# Patient Record
Sex: Male | Born: 1945
Health system: Southern US, Community
[De-identification: ages and names within clinical notes are randomized; demographics above are authoritative.]

## PROBLEM LIST (undated history)

## (undated) DIAGNOSIS — E669 Obesity, unspecified: Secondary | ICD-10-CM

## (undated) DIAGNOSIS — G4733 Obstructive sleep apnea (adult) (pediatric): Secondary | ICD-10-CM

## (undated) DIAGNOSIS — I350 Nonrheumatic aortic (valve) stenosis: Secondary | ICD-10-CM

## (undated) DIAGNOSIS — Z8719 Personal history of other diseases of the digestive system: Secondary | ICD-10-CM

## (undated) DIAGNOSIS — Z953 Presence of xenogenic heart valve: Secondary | ICD-10-CM

## (undated) DIAGNOSIS — A6920 Lyme disease, unspecified: Secondary | ICD-10-CM

## (undated) DIAGNOSIS — K759 Inflammatory liver disease, unspecified: Secondary | ICD-10-CM

## (undated) DIAGNOSIS — E785 Hyperlipidemia, unspecified: Secondary | ICD-10-CM

## (undated) DIAGNOSIS — K219 Gastro-esophageal reflux disease without esophagitis: Secondary | ICD-10-CM

## (undated) DIAGNOSIS — J45909 Unspecified asthma, uncomplicated: Secondary | ICD-10-CM

## (undated) DIAGNOSIS — R0600 Dyspnea, unspecified: Secondary | ICD-10-CM

## (undated) DIAGNOSIS — G4730 Sleep apnea, unspecified: Secondary | ICD-10-CM

## (undated) HISTORY — DX: Nonrheumatic aortic (valve) stenosis: I35.0

## (undated) HISTORY — DX: Dyspnea, unspecified: R06.00

## (undated) HISTORY — DX: Obstructive sleep apnea (adult) (pediatric): G47.33

## (undated) HISTORY — DX: Hyperlipidemia, unspecified: E78.5

## (undated) HISTORY — PX: HERNIA REPAIR: SHX51

## (undated) HISTORY — PX: FRACTURE SURGERY: SHX138

## (undated) HISTORY — PX: EYE SURGERY: SHX253

## (undated) HISTORY — DX: Obesity, unspecified: E66.9

## (undated) HISTORY — DX: Sleep apnea, unspecified: G47.30

---

## 2003-04-19 ENCOUNTER — Encounter (INDEPENDENT_AMBULATORY_CARE_PROVIDER_SITE_OTHER): Payer: Self-pay | Admitting: *Deleted

## 2003-04-19 ENCOUNTER — Ambulatory Visit (HOSPITAL_COMMUNITY): Admission: RE | Admit: 2003-04-19 | Discharge: 2003-04-19 | Payer: Self-pay | Admitting: Gastroenterology

## 2008-05-23 ENCOUNTER — Encounter: Admission: RE | Admit: 2008-05-23 | Discharge: 2008-05-23 | Payer: Self-pay | Admitting: General Surgery

## 2011-04-07 ENCOUNTER — Other Ambulatory Visit: Payer: Self-pay | Admitting: Gastroenterology

## 2011-04-11 NOTE — Op Note (Signed)
NAME:  Devon Novak, Devon Novak                        ACCOUNT NO.:  [id number]   MEDICAL RECORD NO.:  [id number]                   PATIENT TYPE:  AMB   LOCATION:  ENDO                                 FACILITY:  [facility]   PHYSICIAN:  Petra Kuba, M.D.                 DATE OF BIRTH:  20-Aug-1946   DATE OF PROCEDURE:  04/19/2003  DATE OF DISCHARGE:                                 OPERATIVE REPORT   PROCEDURE:  Colonoscopy with polypectomy.   INDICATIONS FOR PROCEDURE:  Family history of colon cancer.  Consent was  signed after risks, benefits, methods, and options were thoroughly discussed  in the office.   MEDICATIONS GIVEN:  Demerol 75, Versed 10.   DESCRIPTION OF PROCEDURE:  Rectal inspection was pertinent for external  hemorrhoids.  Small digital examination was negative.  Video colonoscope was  inserted and easily advanced around the colon to the cecum. This did require  some abdominal pressure, but no position changes. No obvious abnormality was  seen on insertion.  The cecum was identified by the appendiceal orifice and  the ileocecal valve. The scope was slowly withdrawn.  Prep was adequate.  There was some liquid stool that required washing and suctioning.  On slow  withdrawal through the colon, the cecum and the ascending were normal.  In  the more proximal transverse, a small polyp was seen and snared,  electrocautery applied and the polyp was suctioned through the scope and  collected in the trap. The scope was slowly withdrawn.  Back in the distal  sigmoid, a tiny hyperplastic-appearing polyp was seen and a few cold  biopsies were done.  No other abnormalities were seen as the scope was  slowly withdrawn back in the rectum.  Once back in the rectum, the scope was  retroflexed pertinent for some internal hemorrhoids.  The scope was  straightened and readvanced a short ways up the left side of the colon.  Air  was suctioned and the scope removed.  The patient tolerated the  procedure  well.  There was no obvious immediate complications.   ENDOSCOPIC ASSESSMENT:  1. Internal and external hemorrhoids.  2. Proximal transverse small polyp snared.  3. Distal sigmoid hyperplastic-appearing polyp cold biopsied.  4. Otherwise within normal limits to the cecum.   PLAN:  Await pathology to determine future colonic screening.  Happy to see  back p.r.n.  Otherwise return care to Dr. Abigail Miyamoto for the customary health  care maintenance to include yearly rectals and guaiacs.                                               Petra Kuba, M.D.   MEM/MEDQ  D:  04/19/2003  T:  04/19/2003  Job:  [id number]   cc:   Molly Maduro  K. Abigail Miyamoto, M.D.  [address]  [place]  [place] [address]  Fax: [phone]

## 2014-01-31 DIAGNOSIS — N529 Male erectile dysfunction, unspecified: Secondary | ICD-10-CM | POA: Diagnosis not present

## 2014-01-31 DIAGNOSIS — R972 Elevated prostate specific antigen [PSA]: Secondary | ICD-10-CM | POA: Diagnosis not present

## 2014-02-07 DIAGNOSIS — J3489 Other specified disorders of nose and nasal sinuses: Secondary | ICD-10-CM | POA: Diagnosis not present

## 2014-02-07 DIAGNOSIS — H109 Unspecified conjunctivitis: Secondary | ICD-10-CM | POA: Diagnosis not present

## 2014-02-20 DIAGNOSIS — M25569 Pain in unspecified knee: Secondary | ICD-10-CM | POA: Diagnosis not present

## 2014-05-18 DIAGNOSIS — R439 Unspecified disturbances of smell and taste: Secondary | ICD-10-CM | POA: Diagnosis not present

## 2014-06-28 DIAGNOSIS — IMO0001 Reserved for inherently not codable concepts without codable children: Secondary | ICD-10-CM | POA: Diagnosis not present

## 2014-08-01 DIAGNOSIS — R972 Elevated prostate specific antigen [PSA]: Secondary | ICD-10-CM | POA: Diagnosis not present

## 2014-08-09 DIAGNOSIS — Z23 Encounter for immunization: Secondary | ICD-10-CM | POA: Diagnosis not present

## 2014-08-09 DIAGNOSIS — M1990 Unspecified osteoarthritis, unspecified site: Secondary | ICD-10-CM | POA: Diagnosis not present

## 2014-08-09 DIAGNOSIS — E782 Mixed hyperlipidemia: Secondary | ICD-10-CM | POA: Diagnosis not present

## 2014-08-09 DIAGNOSIS — R7309 Other abnormal glucose: Secondary | ICD-10-CM | POA: Diagnosis not present

## 2014-08-09 DIAGNOSIS — Z0000 Encounter for general adult medical examination without abnormal findings: Secondary | ICD-10-CM | POA: Diagnosis not present

## 2014-08-10 DIAGNOSIS — N529 Male erectile dysfunction, unspecified: Secondary | ICD-10-CM | POA: Diagnosis not present

## 2014-08-10 DIAGNOSIS — N401 Enlarged prostate with lower urinary tract symptoms: Secondary | ICD-10-CM | POA: Diagnosis not present

## 2014-08-10 DIAGNOSIS — R972 Elevated prostate specific antigen [PSA]: Secondary | ICD-10-CM | POA: Diagnosis not present

## 2014-08-10 DIAGNOSIS — N138 Other obstructive and reflux uropathy: Secondary | ICD-10-CM | POA: Diagnosis not present

## 2014-09-15 DIAGNOSIS — R3912 Poor urinary stream: Secondary | ICD-10-CM | POA: Diagnosis not present

## 2014-09-15 DIAGNOSIS — R339 Retention of urine, unspecified: Secondary | ICD-10-CM | POA: Diagnosis not present

## 2014-09-15 DIAGNOSIS — N401 Enlarged prostate with lower urinary tract symptoms: Secondary | ICD-10-CM | POA: Diagnosis not present

## 2014-10-03 DIAGNOSIS — R339 Retention of urine, unspecified: Secondary | ICD-10-CM | POA: Diagnosis not present

## 2014-10-03 DIAGNOSIS — R3912 Poor urinary stream: Secondary | ICD-10-CM | POA: Diagnosis not present

## 2014-10-03 DIAGNOSIS — R972 Elevated prostate specific antigen [PSA]: Secondary | ICD-10-CM | POA: Diagnosis not present

## 2014-10-03 DIAGNOSIS — N401 Enlarged prostate with lower urinary tract symptoms: Secondary | ICD-10-CM | POA: Diagnosis not present

## 2014-10-03 DIAGNOSIS — N5201 Erectile dysfunction due to arterial insufficiency: Secondary | ICD-10-CM | POA: Diagnosis not present

## 2014-11-20 DIAGNOSIS — H2513 Age-related nuclear cataract, bilateral: Secondary | ICD-10-CM | POA: Diagnosis not present

## 2015-01-12 DIAGNOSIS — R011 Cardiac murmur, unspecified: Secondary | ICD-10-CM | POA: Diagnosis not present

## 2015-02-26 DIAGNOSIS — N401 Enlarged prostate with lower urinary tract symptoms: Secondary | ICD-10-CM | POA: Diagnosis not present

## 2015-02-26 DIAGNOSIS — N5201 Erectile dysfunction due to arterial insufficiency: Secondary | ICD-10-CM | POA: Diagnosis not present

## 2015-02-26 DIAGNOSIS — R339 Retention of urine, unspecified: Secondary | ICD-10-CM | POA: Diagnosis not present

## 2015-02-26 DIAGNOSIS — R972 Elevated prostate specific antigen [PSA]: Secondary | ICD-10-CM | POA: Diagnosis not present

## 2015-08-24 DIAGNOSIS — Z0000 Encounter for general adult medical examination without abnormal findings: Secondary | ICD-10-CM | POA: Diagnosis not present

## 2015-08-24 DIAGNOSIS — Q253 Supravalvular aortic stenosis: Secondary | ICD-10-CM | POA: Diagnosis not present

## 2015-08-24 DIAGNOSIS — N400 Enlarged prostate without lower urinary tract symptoms: Secondary | ICD-10-CM | POA: Diagnosis not present

## 2015-08-24 DIAGNOSIS — G4730 Sleep apnea, unspecified: Secondary | ICD-10-CM | POA: Diagnosis not present

## 2015-08-24 DIAGNOSIS — E785 Hyperlipidemia, unspecified: Secondary | ICD-10-CM | POA: Diagnosis not present

## 2015-08-24 DIAGNOSIS — Z79899 Other long term (current) drug therapy: Secondary | ICD-10-CM | POA: Diagnosis not present

## 2015-08-24 DIAGNOSIS — Z23 Encounter for immunization: Secondary | ICD-10-CM | POA: Diagnosis not present

## 2015-08-24 DIAGNOSIS — Z86010 Personal history of colonic polyps: Secondary | ICD-10-CM | POA: Diagnosis not present

## 2015-12-27 DIAGNOSIS — M542 Cervicalgia: Secondary | ICD-10-CM | POA: Diagnosis not present

## 2016-01-31 DIAGNOSIS — F4320 Adjustment disorder, unspecified: Secondary | ICD-10-CM | POA: Diagnosis not present

## 2016-02-07 DIAGNOSIS — F4320 Adjustment disorder, unspecified: Secondary | ICD-10-CM | POA: Diagnosis not present

## 2016-02-14 DIAGNOSIS — F4320 Adjustment disorder, unspecified: Secondary | ICD-10-CM | POA: Diagnosis not present

## 2016-02-26 DIAGNOSIS — E785 Hyperlipidemia, unspecified: Secondary | ICD-10-CM | POA: Diagnosis not present

## 2016-02-26 DIAGNOSIS — R509 Fever, unspecified: Secondary | ICD-10-CM | POA: Diagnosis not present

## 2016-02-26 DIAGNOSIS — M791 Myalgia: Secondary | ICD-10-CM | POA: Diagnosis not present

## 2016-02-27 DIAGNOSIS — M791 Myalgia: Secondary | ICD-10-CM | POA: Diagnosis not present

## 2016-02-27 DIAGNOSIS — E8809 Other disorders of plasma-protein metabolism, not elsewhere classified: Secondary | ICD-10-CM | POA: Diagnosis not present

## 2016-02-28 DIAGNOSIS — F4320 Adjustment disorder, unspecified: Secondary | ICD-10-CM | POA: Diagnosis not present

## 2016-03-13 DIAGNOSIS — F4320 Adjustment disorder, unspecified: Secondary | ICD-10-CM | POA: Diagnosis not present

## 2016-03-19 DIAGNOSIS — S61012A Laceration without foreign body of left thumb without damage to nail, initial encounter: Secondary | ICD-10-CM | POA: Diagnosis not present

## 2016-03-27 DIAGNOSIS — F4320 Adjustment disorder, unspecified: Secondary | ICD-10-CM | POA: Diagnosis not present

## 2016-04-07 DIAGNOSIS — E668 Other obesity: Secondary | ICD-10-CM | POA: Diagnosis not present

## 2016-04-07 DIAGNOSIS — R002 Palpitations: Secondary | ICD-10-CM | POA: Diagnosis not present

## 2016-04-07 DIAGNOSIS — G4733 Obstructive sleep apnea (adult) (pediatric): Secondary | ICD-10-CM | POA: Diagnosis not present

## 2016-04-07 DIAGNOSIS — I359 Nonrheumatic aortic valve disorder, unspecified: Secondary | ICD-10-CM | POA: Diagnosis not present

## 2016-04-07 DIAGNOSIS — R011 Cardiac murmur, unspecified: Secondary | ICD-10-CM | POA: Diagnosis not present

## 2016-04-07 DIAGNOSIS — E784 Other hyperlipidemia: Secondary | ICD-10-CM | POA: Diagnosis not present

## 2016-04-07 DIAGNOSIS — I350 Nonrheumatic aortic (valve) stenosis: Secondary | ICD-10-CM | POA: Diagnosis not present

## 2016-04-10 DIAGNOSIS — F4320 Adjustment disorder, unspecified: Secondary | ICD-10-CM | POA: Diagnosis not present

## 2016-07-09 DIAGNOSIS — R972 Elevated prostate specific antigen [PSA]: Secondary | ICD-10-CM | POA: Diagnosis not present

## 2016-08-14 DIAGNOSIS — N401 Enlarged prostate with lower urinary tract symptoms: Secondary | ICD-10-CM | POA: Diagnosis not present

## 2016-08-14 DIAGNOSIS — R339 Retention of urine, unspecified: Secondary | ICD-10-CM | POA: Diagnosis not present

## 2016-08-14 DIAGNOSIS — N5201 Erectile dysfunction due to arterial insufficiency: Secondary | ICD-10-CM | POA: Diagnosis not present

## 2016-08-14 DIAGNOSIS — R972 Elevated prostate specific antigen [PSA]: Secondary | ICD-10-CM | POA: Diagnosis not present

## 2016-09-05 DIAGNOSIS — G4730 Sleep apnea, unspecified: Secondary | ICD-10-CM | POA: Diagnosis not present

## 2016-09-05 DIAGNOSIS — Z0000 Encounter for general adult medical examination without abnormal findings: Secondary | ICD-10-CM | POA: Diagnosis not present

## 2016-09-05 DIAGNOSIS — Z23 Encounter for immunization: Secondary | ICD-10-CM | POA: Diagnosis not present

## 2016-09-05 DIAGNOSIS — Z86010 Personal history of colonic polyps: Secondary | ICD-10-CM | POA: Diagnosis not present

## 2016-09-05 DIAGNOSIS — Q253 Supravalvular aortic stenosis: Secondary | ICD-10-CM | POA: Diagnosis not present

## 2016-09-05 DIAGNOSIS — E8809 Other disorders of plasma-protein metabolism, not elsewhere classified: Secondary | ICD-10-CM | POA: Diagnosis not present

## 2016-09-05 DIAGNOSIS — R972 Elevated prostate specific antigen [PSA]: Secondary | ICD-10-CM | POA: Diagnosis not present

## 2016-09-05 DIAGNOSIS — N400 Enlarged prostate without lower urinary tract symptoms: Secondary | ICD-10-CM | POA: Diagnosis not present

## 2016-09-05 DIAGNOSIS — E782 Mixed hyperlipidemia: Secondary | ICD-10-CM | POA: Diagnosis not present

## 2016-09-05 DIAGNOSIS — Z79899 Other long term (current) drug therapy: Secondary | ICD-10-CM | POA: Diagnosis not present

## 2016-10-21 DIAGNOSIS — Z86010 Personal history of colonic polyps: Secondary | ICD-10-CM | POA: Diagnosis not present

## 2016-10-21 DIAGNOSIS — K635 Polyp of colon: Secondary | ICD-10-CM | POA: Diagnosis not present

## 2016-10-21 DIAGNOSIS — Z800 Family history of malignant neoplasm of digestive organs: Secondary | ICD-10-CM | POA: Diagnosis not present

## 2016-10-21 DIAGNOSIS — D123 Benign neoplasm of transverse colon: Secondary | ICD-10-CM | POA: Diagnosis not present

## 2016-10-23 DIAGNOSIS — K635 Polyp of colon: Secondary | ICD-10-CM | POA: Diagnosis not present

## 2016-11-24 DIAGNOSIS — A6920 Lyme disease, unspecified: Secondary | ICD-10-CM

## 2016-11-24 HISTORY — DX: Lyme disease, unspecified: A69.20

## 2017-01-19 DIAGNOSIS — R339 Retention of urine, unspecified: Secondary | ICD-10-CM | POA: Diagnosis not present

## 2017-01-19 DIAGNOSIS — N401 Enlarged prostate with lower urinary tract symptoms: Secondary | ICD-10-CM | POA: Diagnosis not present

## 2017-01-19 DIAGNOSIS — N5201 Erectile dysfunction due to arterial insufficiency: Secondary | ICD-10-CM | POA: Diagnosis not present

## 2017-01-19 DIAGNOSIS — R972 Elevated prostate specific antigen [PSA]: Secondary | ICD-10-CM | POA: Diagnosis not present

## 2017-04-06 DIAGNOSIS — I350 Nonrheumatic aortic (valve) stenosis: Secondary | ICD-10-CM | POA: Diagnosis not present

## 2017-04-06 DIAGNOSIS — R002 Palpitations: Secondary | ICD-10-CM | POA: Diagnosis not present

## 2017-06-24 DIAGNOSIS — R509 Fever, unspecified: Secondary | ICD-10-CM | POA: Diagnosis not present

## 2017-06-24 DIAGNOSIS — R21 Rash and other nonspecific skin eruption: Secondary | ICD-10-CM | POA: Diagnosis not present

## 2017-06-26 DIAGNOSIS — Z6832 Body mass index (BMI) 32.0-32.9, adult: Secondary | ICD-10-CM | POA: Diagnosis not present

## 2017-06-26 DIAGNOSIS — R509 Fever, unspecified: Secondary | ICD-10-CM | POA: Diagnosis not present

## 2017-06-26 DIAGNOSIS — R899 Unspecified abnormal finding in specimens from other organs, systems and tissues: Secondary | ICD-10-CM | POA: Diagnosis not present

## 2017-06-26 DIAGNOSIS — E669 Obesity, unspecified: Secondary | ICD-10-CM | POA: Diagnosis not present

## 2017-06-30 DIAGNOSIS — R509 Fever, unspecified: Secondary | ICD-10-CM | POA: Diagnosis not present

## 2017-06-30 DIAGNOSIS — R21 Rash and other nonspecific skin eruption: Secondary | ICD-10-CM | POA: Diagnosis not present

## 2017-06-30 DIAGNOSIS — R5081 Fever presenting with conditions classified elsewhere: Secondary | ICD-10-CM | POA: Diagnosis not present

## 2017-07-16 DIAGNOSIS — E668 Other obesity: Secondary | ICD-10-CM | POA: Diagnosis not present

## 2017-07-16 DIAGNOSIS — G4733 Obstructive sleep apnea (adult) (pediatric): Secondary | ICD-10-CM | POA: Diagnosis not present

## 2017-07-16 DIAGNOSIS — E784 Other hyperlipidemia: Secondary | ICD-10-CM | POA: Diagnosis not present

## 2017-07-16 DIAGNOSIS — I350 Nonrheumatic aortic (valve) stenosis: Secondary | ICD-10-CM | POA: Diagnosis not present

## 2017-08-24 DIAGNOSIS — I10 Essential (primary) hypertension: Secondary | ICD-10-CM | POA: Diagnosis not present

## 2017-08-24 DIAGNOSIS — H2512 Age-related nuclear cataract, left eye: Secondary | ICD-10-CM | POA: Diagnosis not present

## 2017-08-24 DIAGNOSIS — H2513 Age-related nuclear cataract, bilateral: Secondary | ICD-10-CM | POA: Diagnosis not present

## 2017-10-05 DIAGNOSIS — E668 Other obesity: Secondary | ICD-10-CM | POA: Diagnosis not present

## 2017-10-05 DIAGNOSIS — G4733 Obstructive sleep apnea (adult) (pediatric): Secondary | ICD-10-CM | POA: Diagnosis not present

## 2017-10-05 DIAGNOSIS — I350 Nonrheumatic aortic (valve) stenosis: Secondary | ICD-10-CM | POA: Diagnosis not present

## 2017-10-08 DIAGNOSIS — Z0000 Encounter for general adult medical examination without abnormal findings: Secondary | ICD-10-CM | POA: Diagnosis not present

## 2017-10-08 DIAGNOSIS — N400 Enlarged prostate without lower urinary tract symptoms: Secondary | ICD-10-CM | POA: Diagnosis not present

## 2017-10-08 DIAGNOSIS — M1990 Unspecified osteoarthritis, unspecified site: Secondary | ICD-10-CM | POA: Diagnosis not present

## 2017-10-08 DIAGNOSIS — Z79899 Other long term (current) drug therapy: Secondary | ICD-10-CM | POA: Diagnosis not present

## 2017-10-08 DIAGNOSIS — G4730 Sleep apnea, unspecified: Secondary | ICD-10-CM | POA: Diagnosis not present

## 2017-10-08 DIAGNOSIS — Z23 Encounter for immunization: Secondary | ICD-10-CM | POA: Diagnosis not present

## 2017-10-08 DIAGNOSIS — R899 Unspecified abnormal finding in specimens from other organs, systems and tissues: Secondary | ICD-10-CM | POA: Diagnosis not present

## 2017-10-08 DIAGNOSIS — E782 Mixed hyperlipidemia: Secondary | ICD-10-CM | POA: Diagnosis not present

## 2017-10-08 DIAGNOSIS — E669 Obesity, unspecified: Secondary | ICD-10-CM | POA: Diagnosis not present

## 2017-10-08 DIAGNOSIS — S61214A Laceration without foreign body of right ring finger without damage to nail, initial encounter: Secondary | ICD-10-CM | POA: Diagnosis not present

## 2017-10-08 DIAGNOSIS — Z6833 Body mass index (BMI) 33.0-33.9, adult: Secondary | ICD-10-CM | POA: Diagnosis not present

## 2017-10-08 DIAGNOSIS — Z86010 Personal history of colonic polyps: Secondary | ICD-10-CM | POA: Diagnosis not present

## 2017-10-08 DIAGNOSIS — Q253 Supravalvular aortic stenosis: Secondary | ICD-10-CM | POA: Diagnosis not present

## 2017-10-22 DIAGNOSIS — H2512 Age-related nuclear cataract, left eye: Secondary | ICD-10-CM | POA: Diagnosis not present

## 2017-10-22 DIAGNOSIS — H52202 Unspecified astigmatism, left eye: Secondary | ICD-10-CM | POA: Diagnosis not present

## 2017-10-22 DIAGNOSIS — H2513 Age-related nuclear cataract, bilateral: Secondary | ICD-10-CM | POA: Diagnosis not present

## 2017-10-23 DIAGNOSIS — H2511 Age-related nuclear cataract, right eye: Secondary | ICD-10-CM | POA: Diagnosis not present

## 2017-10-24 HISTORY — PX: CATARACT EXTRACTION, BILATERAL: SHX1313

## 2017-10-26 DIAGNOSIS — R768 Other specified abnormal immunological findings in serum: Secondary | ICD-10-CM | POA: Diagnosis not present

## 2017-10-26 DIAGNOSIS — M791 Myalgia, unspecified site: Secondary | ICD-10-CM | POA: Diagnosis not present

## 2017-10-26 DIAGNOSIS — Z8619 Personal history of other infectious and parasitic diseases: Secondary | ICD-10-CM | POA: Diagnosis not present

## 2017-10-26 DIAGNOSIS — M1990 Unspecified osteoarthritis, unspecified site: Secondary | ICD-10-CM | POA: Diagnosis not present

## 2017-10-26 DIAGNOSIS — Z85038 Personal history of other malignant neoplasm of large intestine: Secondary | ICD-10-CM | POA: Diagnosis not present

## 2017-10-26 DIAGNOSIS — M2550 Pain in unspecified joint: Secondary | ICD-10-CM | POA: Diagnosis not present

## 2017-10-28 DIAGNOSIS — H2513 Age-related nuclear cataract, bilateral: Secondary | ICD-10-CM | POA: Diagnosis not present

## 2017-11-05 DIAGNOSIS — H2511 Age-related nuclear cataract, right eye: Secondary | ICD-10-CM | POA: Diagnosis not present

## 2017-11-23 DIAGNOSIS — J069 Acute upper respiratory infection, unspecified: Secondary | ICD-10-CM | POA: Diagnosis not present

## 2018-01-07 DIAGNOSIS — M791 Myalgia, unspecified site: Secondary | ICD-10-CM | POA: Diagnosis not present

## 2018-01-07 DIAGNOSIS — M2550 Pain in unspecified joint: Secondary | ICD-10-CM | POA: Diagnosis not present

## 2018-01-07 DIAGNOSIS — M1990 Unspecified osteoarthritis, unspecified site: Secondary | ICD-10-CM | POA: Diagnosis not present

## 2018-01-07 DIAGNOSIS — R768 Other specified abnormal immunological findings in serum: Secondary | ICD-10-CM | POA: Diagnosis not present

## 2018-01-07 DIAGNOSIS — Z8619 Personal history of other infectious and parasitic diseases: Secondary | ICD-10-CM | POA: Diagnosis not present

## 2018-01-07 DIAGNOSIS — Z85038 Personal history of other malignant neoplasm of large intestine: Secondary | ICD-10-CM | POA: Diagnosis not present

## 2018-01-08 DIAGNOSIS — R972 Elevated prostate specific antigen [PSA]: Secondary | ICD-10-CM | POA: Diagnosis not present

## 2018-01-14 DIAGNOSIS — N401 Enlarged prostate with lower urinary tract symptoms: Secondary | ICD-10-CM | POA: Diagnosis not present

## 2018-01-14 DIAGNOSIS — N5201 Erectile dysfunction due to arterial insufficiency: Secondary | ICD-10-CM | POA: Diagnosis not present

## 2018-01-14 DIAGNOSIS — R3912 Poor urinary stream: Secondary | ICD-10-CM | POA: Diagnosis not present

## 2018-01-14 DIAGNOSIS — R972 Elevated prostate specific antigen [PSA]: Secondary | ICD-10-CM | POA: Diagnosis not present

## 2018-04-05 ENCOUNTER — Encounter: Payer: Self-pay | Admitting: Cardiology

## 2018-04-05 DIAGNOSIS — I359 Nonrheumatic aortic valve disorder, unspecified: Secondary | ICD-10-CM | POA: Diagnosis not present

## 2018-04-05 DIAGNOSIS — E668 Other obesity: Secondary | ICD-10-CM | POA: Diagnosis not present

## 2018-04-05 DIAGNOSIS — I350 Nonrheumatic aortic (valve) stenosis: Secondary | ICD-10-CM | POA: Diagnosis not present

## 2018-04-05 DIAGNOSIS — E7849 Other hyperlipidemia: Secondary | ICD-10-CM | POA: Diagnosis not present

## 2018-04-05 DIAGNOSIS — G4733 Obstructive sleep apnea (adult) (pediatric): Secondary | ICD-10-CM | POA: Diagnosis not present

## 2018-04-05 NOTE — Consult Note (Signed)
[facility]  Date of visit:  04/05/2018 DOB:  07/15/1946    Age:  72 yrs. Medical record number:  [id number]     Account number:  [id number] Primary Care Provider: [name] ____________________________ CURRENT DIAGNOSES  1. Aortic valve stenosis  2. Sleep apnea  3. Obesity  4. Hyperlipidemia  5. Dyspnea ____________________________ ALLERGIES  No Known Allergies ____________________________ MEDICATIONS  1. aspirin 81 mg chewable tablet, 1 p.o. daily  2. atorvastatin 40 mg tablet, 1 p.o. daily  3. CoQ-10 30 mg capsule, 1 p.o. daily  4. finasteride 5 mg tablet, 1 p.o. daily  5. Glucosamine-Chondroitin-MSM 750 mg-625 mg-30 mg-1 mg tablet, 1 p.o. daily  6. melatonin 5 mg tablet, QHS  7. multivitamin tablet, 1 p.o. daily  8. saw palmetto fruit 450 mg capsule, 1 p.o. daily  9. sildenafil 20 mg tablet, PRN  10. turmeric root extract 500 mg capsule, 1 p.o. daily  11. zolpidem 10 mg tablet, PRN ____________________________ CHIEF COMPLAINTS  dyspnea  Followup of Aortic valve stenosis ____________________________ HISTORY OF PRESENT ILLNESS This 72 year old male was seen by me 3 years ago murmur was noted on a preemployment physical. He was found to have moderate aortic stenosis with a mean gradient of 32 mm and has been followed since then with serial ECHOs. He is physically active and previously asymptomatic previously and has been able to hike the [place] trail. He had been asymptomatic until a few weeks ago when he noticed significant dyspnea on beginning to walk up hills. He has not had any chest pain suggestive of angina or syncope. He returned today for followup and had a repeat echo today showing a mean gradient now of 51 mm and mild to moderate aortic regurgitation. He denies PND orthopnea or significant edema. He is moderately overweight. ____________________________ PAST HISTORY  Past Medical Illnesses:  hyperlipidemia, obesity, sleep apnea, BPH;  Cardiovascular  Illnesses:  aortic stenosis;  Surgical Procedures:  inguinal herniorrhaphy-rt, inguinal herniorrhaphy-left, umbilical hernia;  NYHA Classification:  I;  Canadian Angina Classification:  Class 0: Asymptomatic;  Cardiology Procedures-Invasive:  no history of prior cardiac procedures;  Cardiology Procedures-Noninvasive:  treadmill, echocardiogram May 2019;  LVEF of 60% documented via echocardiogram on 04/05/2018,   ____________________________ CARDIO-PULMONARY TEST DATES EKG Date:  07/16/2017;  Echocardiography Date: 04/05/2018;   ____________________________ FAMILY HISTORY Brother -- Brother alive and well Brother -- Brother alive and well Father -- Renal failure syndrome, Father dead Mother -- Bowel cancer, Mother dead Sister -- Sister alive and well ____________________________ SOCIAL HISTORY Alcohol Use:  2 q week;  Smoking:  cigars occasionally;  Lifestyle:  divorced and remarried;  Exercise:  yoga and dance;  Occupation:  retired, Surveyor, [employer];  Residence:  lives with wife;   ____________________________ REVIEW OF SYSTEMS General:  denies recent weight change, fatique or change in exercise tolerance. Eyes: cataracts Respiratory: dyspnea with exertion Cardiovascular:  please review HPI  Genitourinary-Male: hesitancy, erectile dysfunction  Musculoskeletal:  arthritis of the wrists Neurological:  denies headaches, stroke, or TIA Psychiatric:  denies depression or anxiety  ____________________________ PHYSICAL EXAMINATION VITAL SIGNS  Blood Pressure:  130/80 Sitting, Left arm, large cuff  , 140/78 Standing, Left arm and large cuff   Pulse:  68/min. Weight:  223.00 lbs. Height:  69.00"BMI: 33  Constitutional:  pleasant white male in no acute distress, mildly obese Skin:  warm and dry to touch, no apparent skin lesions, or masses noted. Head:  normocephalic, balding male hair pattern Neck:  supple, without massess. No JVD, thyromegaly or  carotid bruits. Carotid upstroke normal. Chest:  normal  symmetry, clear to auscultation. Cardiac:  regular rhythm, normal S1, S2 diminished, no S3 or S4, grade 2-3 holosytolic mumur loudest at aortic area and along LSB but heard also at apex somehwat softer, mild transmission to the neck Peripheral Pulses:  the femoral,dorsalis pedis, and posterior tibial pulses are full and equal bilaterally with no bruits auscultated. Extremities & Back:  no deformities, clubbing, cyanosis, erythema or edema observed. Normal muscle strength and tone. Neurological:  no gross motor or sensory deficits noted, affect appropriate, oriented x3. ____________________________ MOST RECENT LIPID PANEL 10/08/17  CHOL TOTL 120 mg/dl, LDL 54 NM, HDL 55 mg/dl, TRIGLYCER 53 mg/dl, ALT 17 u/l and AST 26 u/l ____________________________ IMPRESSIONS/PLAN  1. Severe symptomatic aortic stenosis 2. Sleep apnea 3. Obesity with weight gain 4. Hyperlipidemia under tratment  Recommendations:  Results of echocardiogram reviewed with patient. He now has symptomatic aortic stenosis and although he was severe at his last visit  now symptoms have recently appeared so we'll need to consider valve replacement. Plan to refer to the structural heart disease team for cardiac catheterization and further assessment. Discussed cardiac catheterization with the patient today. ____________________________ Cardiology Physician:  Kerry Hough MD [facility]

## 2018-04-08 ENCOUNTER — Ambulatory Visit (INDEPENDENT_AMBULATORY_CARE_PROVIDER_SITE_OTHER): Payer: Medicare Other | Admitting: Cardiovascular Disease

## 2018-04-08 VITALS — BP 150/90 | HR 61 | Ht 69.0 in | Wt 224.4 lb

## 2018-04-08 DIAGNOSIS — I350 Nonrheumatic aortic (valve) stenosis: Secondary | ICD-10-CM

## 2018-04-08 LAB — BASIC METABOLIC PANEL
BUN / CREAT RATIO: 12 (ref 10–24)
BUN: 12 mg/dL (ref 8–27)
CALCIUM: 9 mg/dL (ref 8.6–10.2)
CO2: 24 mmol/L (ref 20–29)
CREATININE: 0.97 mg/dL (ref 0.76–1.27)
Chloride: 103 mmol/L (ref 96–106)
GFR calc non Af Amer: 78 mL/min/{1.73_m2} (ref >59)
GFR, EST AFRICAN AMERICAN: 90 mL/min/{1.73_m2} (ref >59)
GLUCOSE: 98 mg/dL (ref 65–99)
Potassium: 4.6 mmol/L (ref 3.5–5.2)
Sodium: 140 mmol/L (ref 134–144)

## 2018-04-08 LAB — CBC WITH DIFFERENTIAL/PLATELET
BASOS ABS: 0 10*3/uL (ref 0.0–0.2)
Basos: 1 %
EOS (ABSOLUTE): 0.1 10*3/uL (ref 0.0–0.4)
EOS: 2 %
HEMOGLOBIN: 13.9 g/dL (ref 13.0–17.7)
Hematocrit: 39.3 % (ref 37.5–51.0)
IMMATURE GRANS (ABS): 0 10*3/uL (ref 0.0–0.1)
Immature Granulocytes: 0 %
LYMPHS ABS: 1.5 10*3/uL (ref 0.7–3.1)
LYMPHS: 22 %
MCH: 30.6 pg (ref 26.6–33.0)
MCHC: 35.4 g/dL (ref 31.5–35.7)
MCV: 87 fL (ref 79–97)
MONOCYTES: 11 %
Monocytes Absolute: 0.8 10*3/uL (ref 0.1–0.9)
Neutrophils Absolute: 4.4 10*3/uL (ref 1.4–7.0)
Neutrophils: 64 %
Platelets: 249 10*3/uL (ref 150–379)
RBC: 4.54 x10E6/uL (ref 4.14–5.80)
RDW: 13.5 % (ref 12.3–15.4)
WBC: 6.8 10*3/uL (ref 3.4–10.8)

## 2018-04-08 NOTE — H&P (View-Only) (Signed)
Cardiology Office Note Date:  04/08/2018   ID:  Devon Novak, DOB [birth year], MRN [id number]  PCP:  [name], MD  Cardiologist:  Dr Wynonia Lawman   Chief Complaint  Patient presents with  . Aortic Stenosis     History of Present Illness: Devon Novak is a 72 y.o. male who presents for evaluation of symptomatic aortic stenosis, referred by Dr Wynonia Lawman.  He is here alone today. The patient was initially referred to Dr Wynonia Lawman approximately 3 years ago when he was diagnosed with a heart murmur. He was found to have moderate aortic stenosis and has been followed with annual exams and echo studies since that time. He has been asymptomatic until 6-8 weeks ago. Last year he was able to hike the [place] without significant symptoms. However, when he tried to resume hiking this year, he has become limited by shortness of breath. He now admits to progressive dyspnea and chest tightness with physical activity. He has symptoms associated with climbing stairs.  The patient has had no chest pain or shortness of breath at rest.  He denies edema, orthopnea, or PND.  He has had no lightheadedness or syncope.   Past Medical History:  Diagnosis Date  . Aortic valve stenosis   . Dyspnea   . Hyperlipidemia   . Obesity   . Sleep apnea     History reviewed. No pertinent surgical history.  Current Outpatient Medications  Medication Sig Dispense Refill  . aspirin EC 81 MG tablet Take 81 mg by mouth daily.    [name] atorvastatin (LIPITOR) 40 MG tablet Take 40 mg by mouth daily.    [name] co-enzyme Q-10 30 MG capsule Take 30 mg by mouth 3 (three) times daily.    . finasteride (PROSCAR) 5 MG tablet Take 5 mg by mouth daily.    . Melatonin 5 MG TABS Take by mouth.    . Misc Natural Products (GLUCOSAMINE CHOND MSM FORMULA PO) Take by mouth. 750 MG/625MG /30 MG/1 MG TABLET, TAKE 1 TABLET BY MOUTH DAILY    . Multiple Vitamin (MULTIVITAMIN) tablet Take 1 tablet by mouth daily.    . Saw Palmetto 450 MG CAPS  Take by mouth.    . sildenafil (REVATIO) 20 MG tablet Take 20 mg by mouth as needed.    . Turmeric 500 MG CAPS Take by mouth. TAKE ONE TABLET DAILY    . zolpidem (AMBIEN) 10 MG tablet Take 10 mg by mouth at bedtime as needed for sleep.     No current facility-administered medications for this visit.     Allergies:   Patient has no known allergies.   Social History:  The patient  reports that he has quit smoking. His smoking use included cigarettes. He quit smokeless tobacco use about 44 years ago. His smokeless tobacco use included chew. He reports that he drinks alcohol. He reports that he does not use drugs.   Family History:  The patient's  family history includes Arrhythmia in his sister.  Heart murmur in his sister. No CAD in the family.   ROS:  Please see the history of present illness.  Otherwise, review of systems is positive for cough.  All other systems are reviewed and negative.    PHYSICAL EXAM: VS:  BP (!) 150/90   Pulse 61   Ht 5\' 9"  (1.753 m)   Wt 224 lb 6.4 oz (101.8 kg)   SpO2 97%   BMI 33.14 kg/m  , BMI Body mass index is 33.14 kg/m.  GEN: Well nourished, well developed, in no acute distress  HEENT: normal  Neck: no JVD, no masses. Bilateral carotid bruits Cardiac: RRR with 3/6 harsh late peaking systolic murmur, diminished A2, and no appreciable diastolic murmur     Respiratory:  clear to auscultation bilaterally, normal work of breathing GI: soft, nontender, nondistended, + BS MS: no deformity or atrophy  Ext: no pretibial edema, pedal pulses 2+= bilaterally Skin: warm and dry, no rash Neuro:  Strength and sensation are intact Psych: euthymic mood, full affect  EKG:  EKG is ordered today. The ekg ordered today shows normal sinus rhythm 61 bpm, T wave abnormality consider lateral ischemia.  Recent Labs: No results found for requested labs within last 8760 hours.   Lipid Panel  No results found for: CHOL, TRIG, HDL, CHOLHDL, VLDL, LDLCALC, LDLDIRECT     Wt Readings from Last 3 Encounters:  04/08/18 224 lb 6.4 oz (101.8 kg)    STS Risk Calculator: Risk of Mortality: 0.561% Renal Failure: 0.640% Permanent Stroke: 0.815% Prolonged Ventilation: 2.837% DSW Infection: 0.099% Reoperation: 2.780% Morbidity or Mortality: 6.047% Short Length of Stay: 61.369% Long Length of Stay: 1.583%  ASSESSMENT AND PLAN: This is a 72 year old gentleman with severe, stage D1, symptomatic aortic stenosis.  He now presents with progressive dyspnea and chest discomfort, currently with functional class III limitation.  I have personally reviewed his echo images which demonstrate normal LV systolic function.  The aortic valve is calcified and restricted.  Parasternal acoustic windows are poor, but there does appear to be systolic doming of the aortic valve and the patient may have a bicuspid valve.  There is very severe aortic stenosis with a peak systolic velocity of 5.6 m/s.  There appears to be mild aortic insufficiency.  The peak trans-aortic gradient is greater than 100 mmHg.  I have reviewed the natural history of aortic stenosis with the patient today. We have discussed the limitations of medical therapy and the poor prognosis associated with symptomatic aortic stenosis. We have reviewed potential treatment options, including palliative medical therapy, conventional surgical aortic valve replacement, and transcatheter aortic valve replacement. We discussed treatment options in the context of the patient's specific comorbid medical conditions. Considering his low STS-risk and lack of comorbid medical problems, he understands that he likely will be treated with conventional heart surgery. Our multidisciplinary heart valve team will review his case after cath and CT studies are completed. He is particularly interesting in a mini-thoracotomy approach if this is technically feasible.   We reviewed further evaluation to prepare for aortic valve replacement.  The patient will  undergo right and left heart catheterization and CTA studies of the heart as well as the chest/abdomen/pelvis. I have reviewed the risks, indications, and alternatives to cardiac catheterization, possible angioplasty, and stenting with the patient. Risks include but are not limited to bleeding, infection, vascular injury, stroke, myocardial infection, arrhythmia, kidney injury, radiation-related injury in the case of prolonged fluoroscopy use, emergency cardiac surgery, and death. The patient understands the risks of serious complication is 1-2 in 7829 with diagnostic cardiac cath.  Current medicines are reviewed with the patient today.  The patient does not have concerns regarding medicines.  Labs/ tests ordered today include:   Orders Placed This Encounter  Procedures  . CT CORONARY MORPH W/CTA COR W/SCORE W/CA W/CM &/OR WO/CM  . CT ANGIO ABDOMEN PELVIS  W &/OR WO CONTRAST  . CT ANGIO CHEST AORTA W &/OR WO CONTRAST  . Basic metabolic panel  . CBC with Differential/Platelet  .  Ambulatory referral to Cardiothoracic Surgery  . EKG 12-Lead    Disposition:   FU pending test results  Signed, Sherren Mocha, MD  04/08/2018 12:55 PM    [facility] [place], [place], [place]  [address] Phone: [phone]; Fax: [phone]

## 2018-04-08 NOTE — Progress Notes (Signed)
Cardiology Office Note Date:  04/08/2018   ID:  Devon Novak, DOB 01-22-1946, MRN [id number]  PCP:  [name], MD  Cardiologist:  Dr Wynonia Lawman   Chief Complaint  Patient presents with  . Aortic Stenosis     History of Present Illness: Devon Novak is a 72 y.o. male who presents for evaluation of symptomatic aortic stenosis, referred by Dr Wynonia Lawman.  He is here alone today. The patient was initially referred to Dr Wynonia Lawman approximately 3 years ago when he was diagnosed with a heart murmur. He was found to have moderate aortic stenosis and has been followed with annual exams and echo studies since that time. He has been asymptomatic until 6-8 weeks ago. Last year he was able to hike the [place] without significant symptoms. However, when he tried to resume hiking this year, he has become limited by shortness of breath. He now admits to progressive dyspnea and chest tightness with physical activity. He has symptoms associated with climbing stairs.  The patient has had no chest pain or shortness of breath at rest.  He denies edema, orthopnea, or PND.  He has had no lightheadedness or syncope.   Past Medical History:  Diagnosis Date  . Aortic valve stenosis   . Dyspnea   . Hyperlipidemia   . Obesity   . Sleep apnea     History reviewed. No pertinent surgical history.  Current Outpatient Medications  Medication Sig Dispense Refill  . aspirin EC 81 MG tablet Take 81 mg by mouth daily.    [name] atorvastatin (LIPITOR) 40 MG tablet Take 40 mg by mouth daily.    [name] co-enzyme Q-10 30 MG capsule Take 30 mg by mouth 3 (three) times daily.    . finasteride (PROSCAR) 5 MG tablet Take 5 mg by mouth daily.    . Melatonin 5 MG TABS Take by mouth.    . Misc Natural Products (GLUCOSAMINE CHOND MSM FORMULA PO) Take by mouth. 750 MG/625MG /30 MG/1 MG TABLET, TAKE 1 TABLET BY MOUTH DAILY    . Multiple Vitamin (MULTIVITAMIN) tablet Take 1 tablet by mouth daily.    . Saw Palmetto 450 MG CAPS  Take by mouth.    . sildenafil (REVATIO) 20 MG tablet Take 20 mg by mouth as needed.    . Turmeric 500 MG CAPS Take by mouth. TAKE ONE TABLET DAILY    . zolpidem (AMBIEN) 10 MG tablet Take 10 mg by mouth at bedtime as needed for sleep.     No current facility-administered medications for this visit.     Allergies:   Patient has no known allergies.   Social History:  The patient  reports that he has quit smoking. His smoking use included cigarettes. He quit smokeless tobacco use about 44 years ago. His smokeless tobacco use included chew. He reports that he drinks alcohol. He reports that he does not use drugs.   Family History:  The patient's  family history includes Arrhythmia in his sister.  Heart murmur in his sister. No CAD in the family.   ROS:  Please see the history of present illness.  Otherwise, review of systems is positive for cough.  All other systems are reviewed and negative.    PHYSICAL EXAM: VS:  BP (!) 150/90   Pulse 61   Ht 5\' 9"  (1.753 m)   Wt 224 lb 6.4 oz (101.8 kg)   SpO2 97%   BMI 33.14 kg/m  , BMI Body mass index is 33.14 kg/m.  GEN: Well nourished, well developed, in no acute distress  HEENT: normal  Neck: no JVD, no masses. Bilateral carotid bruits Cardiac: RRR with 3/6 harsh late peaking systolic murmur, diminished A2, and no appreciable diastolic murmur     Respiratory:  clear to auscultation bilaterally, normal work of breathing GI: soft, nontender, nondistended, + BS MS: no deformity or atrophy  Ext: no pretibial edema, pedal pulses 2+= bilaterally Skin: warm and dry, no rash Neuro:  Strength and sensation are intact Psych: euthymic mood, full affect  EKG:  EKG is ordered today. The ekg ordered today shows normal sinus rhythm 61 bpm, T wave abnormality consider lateral ischemia.  Recent Labs: No results found for requested labs within last 8760 hours.   Lipid Panel  No results found for: CHOL, TRIG, HDL, CHOLHDL, VLDL, LDLCALC, LDLDIRECT     Wt Readings from Last 3 Encounters:  04/08/18 224 lb 6.4 oz (101.8 kg)    STS Risk Calculator: Risk of Mortality: 0.561% Renal Failure: 0.640% Permanent Stroke: 0.815% Prolonged Ventilation: 2.837% DSW Infection: 0.099% Reoperation: 2.780% Morbidity or Mortality: 6.047% Short Length of Stay: 61.369% Long Length of Stay: 1.583%  ASSESSMENT AND PLAN: This is a 72 year old gentleman with severe, stage D1, symptomatic aortic stenosis.  He now presents with progressive dyspnea and chest discomfort, currently with functional class III limitation.  I have personally reviewed his echo images which demonstrate normal LV systolic function.  The aortic valve is calcified and restricted.  Parasternal acoustic windows are poor, but there does appear to be systolic doming of the aortic valve and the patient may have a bicuspid valve.  There is very severe aortic stenosis with a peak systolic velocity of 5.6 m/s.  There appears to be mild aortic insufficiency.  The peak trans-aortic gradient is greater than 100 mmHg.  I have reviewed the natural history of aortic stenosis with the patient today. We have discussed the limitations of medical therapy and the poor prognosis associated with symptomatic aortic stenosis. We have reviewed potential treatment options, including palliative medical therapy, conventional surgical aortic valve replacement, and transcatheter aortic valve replacement. We discussed treatment options in the context of the patient's specific comorbid medical conditions. Considering his low STS-risk and lack of comorbid medical problems, he understands that he likely will be treated with conventional heart surgery. Our multidisciplinary heart valve team will review his case after cath and CT studies are completed. He is particularly interesting in a mini-thoracotomy approach if this is technically feasible.   We reviewed further evaluation to prepare for aortic valve replacement.  The patient will  undergo right and left heart catheterization and CTA studies of the heart as well as the chest/abdomen/pelvis. I have reviewed the risks, indications, and alternatives to cardiac catheterization, possible angioplasty, and stenting with the patient. Risks include but are not limited to bleeding, infection, vascular injury, stroke, myocardial infection, arrhythmia, kidney injury, radiation-related injury in the case of prolonged fluoroscopy use, emergency cardiac surgery, and death. The patient understands the risks of serious complication is 1-2 in 9518 with diagnostic cardiac cath.  Current medicines are reviewed with the patient today.  The patient does not have concerns regarding medicines.  Labs/ tests ordered today include:   Orders Placed This Encounter  Procedures  . CT CORONARY MORPH W/CTA COR W/SCORE W/CA W/CM &/OR WO/CM  . CT ANGIO ABDOMEN PELVIS  W &/OR WO CONTRAST  . CT ANGIO CHEST AORTA W &/OR WO CONTRAST  . Basic metabolic panel  . CBC with Differential/Platelet  .  Ambulatory referral to Cardiothoracic Surgery  . EKG 12-Lead    Disposition:   FU pending test results  Signed, Sherren Mocha, MD  04/08/2018 12:55 PM    [facility] [place], [place], [place]  [address] Phone: [phone]; Fax: [phone]

## 2018-04-08 NOTE — Patient Instructions (Addendum)
Medication Instructions:  Your provider recommends that you continue on your current medications as directed. Please refer to the Current Medication list given to you today.    Labwork: TODAY! BMET, CBC  Testing/Procedures: Your physician has requested that you have a cardiac catheterization. Cardiac catheterization is used to diagnose and/or treat various heart conditions. Doctors may recommend this procedure for a number of different reasons. The most common reason is to evaluate chest pain. Chest pain can be a symptom of coronary artery disease (CAD), and cardiac catheterization can show whether plaque is narrowing or blocking your heart's arteries. This procedure is also used to evaluate the valves, as well as measure the blood flow and oxygen levels in different parts of your heart. For further information please visit [email or web address]. Please follow instruction sheet, as given.  Follow-Up: You will be called to arrange follow-up appointments.   Any Other Special Instructions Will Be Listed Below (If Applicable).   [facility] OFF[facility] [address], [place] [address] [place] [address] Dept: [phone] Loc: [phone]  KEVONTAE BURGOON  04/08/2018  You are scheduled for a Cardiac Catheterization on Tuesday, May 28 with Dr. Sherren Mocha.  1. Please arrive at the [facility] (Main Entrance A) at [facility]: [place], [place] [address] at 10:00 AM (two hours before your procedure to ensure your preparation). Free valet parking service is available.   Special note: Every effort is made to have your procedure done on time. Please understand that emergencies sometimes delay scheduled procedures.  2. Diet: Do not eat solid foods past midnight the night before your cath. You may have clear liquids until 5:00AM the morning of your procedure. Stay well hydrated the day before your  cath!  3. Labs: TODAY!  4. Medication instructions in preparation for your procedure:  1) You may take all your medications as directed with sips of water  2) Make sure to take ASPIRIN 81 mg the morning of your procedure  5. Plan for one night stay--bring personal belongings. 6. Bring a current list of your medications and current insurance cards. 7. You MUST have a responsible person to drive you home. 8. Someone MUST be with you the first 24 hours after you arrive home or your discharge will be delayed. 9. Please wear clothes that are easy to get on and off and wear slip-on shoes.  Thank you for allowing [place] to care for you!   -- [facility]     If you need a refill on your cardiac medications before your next appointment, please call your pharmacy.

## 2018-04-12 ENCOUNTER — Encounter: Payer: Self-pay | Admitting: Cardiovascular Disease

## 2018-04-12 NOTE — Telephone Encounter (Signed)
Confirmed all upcoming appointments with patient. Informed him he may drive himself to [place] as long as he does not take any sedatives. He was grateful for clarification.

## 2018-04-13 ENCOUNTER — Other Ambulatory Visit: Payer: Self-pay

## 2018-04-13 ENCOUNTER — Telehealth: Payer: Self-pay | Admitting: *Deleted

## 2018-04-13 MED ORDER — METOPROLOL TARTRATE 50 MG PO TABS: ORAL_TABLET | ORAL | 0 refills | Status: DC

## 2018-04-13 NOTE — Telephone Encounter (Signed)
Pt contacted pre-catheterization scheduled at [facility] for: Tuesday Apr 20, 2018 12 noon Verified arrival time and place: [place] Entrance A at: 10 AM  No solid food after midnight prior to cath, clear liquids until 5 AM day of procedure. Verified allergies in Epic Verified no diabetes medications.  Hold: Sildenafil 24 hours pre procedure  AM meds can be  taken pre-cath with sip of water including: ASA 81 mg  Confirmed patient has responsible person to drive home post procedure and observe patient for 24 hours: yes

## 2018-04-15 ENCOUNTER — Telehealth: Payer: Self-pay | Admitting: Cardiovascular Disease

## 2018-04-15 NOTE — Telephone Encounter (Signed)
Confirmed with Kristopher Oppenheim that Rx was received. Informed patient's wife medication was called in and should be ready for pickup. She was grateful for call.

## 2018-04-15 NOTE — Telephone Encounter (Signed)
New message:       Pt's wife is calling that a prescription was supposed to be called in for the pt for one pill for him to take before his CT on yesterday. Pt needs this pill sent to  [place], [place]

## 2018-04-16 ENCOUNTER — Ambulatory Visit (HOSPITAL_COMMUNITY)
Admission: RE | Admit: 2018-04-16 | Discharge: 2018-04-16 | Disposition: A | Discharge status: Home / Self Care | Payer: Medicare Other | Source: Ambulatory Visit | Attending: Cardiovascular Disease | Admitting: Cardiovascular Disease

## 2018-04-16 ENCOUNTER — Encounter (HOSPITAL_COMMUNITY): Payer: Self-pay

## 2018-04-16 DIAGNOSIS — I77810 Thoracic aortic ectasia: Secondary | ICD-10-CM | POA: Diagnosis not present

## 2018-04-16 DIAGNOSIS — I350 Nonrheumatic aortic (valve) stenosis: Secondary | ICD-10-CM

## 2018-04-16 DIAGNOSIS — I700 Atherosclerosis of aorta: Secondary | ICD-10-CM | POA: Diagnosis not present

## 2018-04-16 MED ORDER — IOPAMIDOL (ISOVUE-370) INJECTION 76%
100.0000 mL | Freq: Once | INTRAVENOUS | Status: AC | PRN
  Administered 2018-04-16: 100 mL via INTRAVENOUS

## 2018-04-16 MED ORDER — IOPAMIDOL (ISOVUE-370) INJECTION 76%
INTRAVENOUS | Status: AC
  Filled 2018-04-16: qty 100

## 2018-04-17 ENCOUNTER — Encounter: Payer: Self-pay | Admitting: Thoracic Surgery (Cardiothoracic Vascular Surgery)

## 2018-04-20 ENCOUNTER — Encounter (HOSPITAL_COMMUNITY)
Admission: RE | Disposition: A | Discharge status: Home / Self Care | Payer: Self-pay | Source: Ambulatory Visit | Attending: Cardiovascular Disease

## 2018-04-20 ENCOUNTER — Ambulatory Visit (HOSPITAL_COMMUNITY)
Admission: RE | Admit: 2018-04-20 | Discharge: 2018-04-20 | Disposition: A | Discharge status: Home / Self Care | Payer: Medicare Other | Source: Ambulatory Visit | Attending: Cardiovascular Disease | Admitting: Cardiovascular Disease

## 2018-04-20 DIAGNOSIS — E785 Hyperlipidemia, unspecified: Secondary | ICD-10-CM | POA: Insufficient documentation

## 2018-04-20 DIAGNOSIS — Z7982 Long term (current) use of aspirin: Secondary | ICD-10-CM | POA: Diagnosis not present

## 2018-04-20 DIAGNOSIS — Z6833 Body mass index (BMI) 33.0-33.9, adult: Secondary | ICD-10-CM | POA: Insufficient documentation

## 2018-04-20 DIAGNOSIS — E669 Obesity, unspecified: Secondary | ICD-10-CM | POA: Insufficient documentation

## 2018-04-20 DIAGNOSIS — Z720 Tobacco use: Secondary | ICD-10-CM | POA: Diagnosis not present

## 2018-04-20 DIAGNOSIS — R0789 Other chest pain: Secondary | ICD-10-CM | POA: Diagnosis not present

## 2018-04-20 DIAGNOSIS — G4730 Sleep apnea, unspecified: Secondary | ICD-10-CM | POA: Insufficient documentation

## 2018-04-20 DIAGNOSIS — I350 Nonrheumatic aortic (valve) stenosis: Secondary | ICD-10-CM

## 2018-04-20 HISTORY — PX: RIGHT/LEFT HEART CATH AND CORONARY ANGIOGRAPHY: CATH118266

## 2018-04-20 HISTORY — DX: Nonrheumatic aortic (valve) stenosis: I35.0

## 2018-04-20 LAB — POCT I-STAT 3, ART BLOOD GAS (G3+)
Acid-base deficit: 2 mmol/L (ref 0.0–2.0)
Bicarbonate: 23.4 mmol/L (ref 20.0–28.0)
O2 Saturation: 98 %
PCO2 ART: 40.6 mmHg (ref 32.0–48.0)
TCO2: 25 mmol/L (ref 22–32)
pH, Arterial: 7.368 (ref 7.350–7.450)
pO2, Arterial: 103 mmHg (ref 83.0–108.0)

## 2018-04-20 LAB — POCT I-STAT 3, VENOUS BLOOD GAS (G3P V)
ACID-BASE DEFICIT: 1 mmol/L (ref 0.0–2.0)
Bicarbonate: 24.3 mmol/L (ref 20.0–28.0)
O2 Saturation: 72 %
TCO2: 26 mmol/L (ref 22–32)
pCO2, Ven: 43.5 mmHg — ABNORMAL LOW (ref 44.0–60.0)
pH, Ven: 7.355 (ref 7.250–7.430)
pO2, Ven: 40 mmHg (ref 32.0–45.0)

## 2018-04-20 SURGERY — RIGHT/LEFT HEART CATH AND CORONARY ANGIOGRAPHY
Anesthesia: LOCAL

## 2018-04-20 MED ORDER — HEPARIN (PORCINE) IN NACL 2-0.9 UNITS/ML
INTRAMUSCULAR | Status: AC | PRN
  Administered 2018-04-20 (×3): 500 mL

## 2018-04-20 MED ORDER — MIDAZOLAM HCL 2 MG/2ML IJ SOLN
INTRAMUSCULAR | Status: AC
  Filled 2018-04-20: qty 2

## 2018-04-20 MED ORDER — HEPARIN SODIUM (PORCINE) 1000 UNIT/ML IJ SOLN
INTRAMUSCULAR | Status: DC | PRN
  Administered 2018-04-20: 4000 [IU] via INTRAVENOUS

## 2018-04-20 MED ORDER — VERAPAMIL HCL 2.5 MG/ML IV SOLN
INTRAVENOUS | Status: DC | PRN
  Administered 2018-04-20: 10 mL via INTRA_ARTERIAL

## 2018-04-20 MED ORDER — FENTANYL CITRATE (PF) 100 MCG/2ML IJ SOLN
INTRAMUSCULAR | Status: AC
  Filled 2018-04-20: qty 2

## 2018-04-20 MED ORDER — HEPARIN SODIUM (PORCINE) 1000 UNIT/ML IJ SOLN
INTRAMUSCULAR | Status: AC
  Filled 2018-04-20: qty 1

## 2018-04-20 MED ORDER — SODIUM CHLORIDE 0.9 % WEIGHT BASED INFUSION: 1.0000 mL/kg/h | INTRAVENOUS | Status: DC

## 2018-04-20 MED ORDER — VERAPAMIL HCL 2.5 MG/ML IV SOLN
INTRAVENOUS | Status: AC
  Filled 2018-04-20: qty 2

## 2018-04-20 MED ORDER — IOHEXOL 350 MG/ML SOLN
INTRAVENOUS | Status: DC | PRN
  Administered 2018-04-20: 75 mL via INTRA_ARTERIAL

## 2018-04-20 MED ORDER — LIDOCAINE HCL (PF) 1 % IJ SOLN
INTRAMUSCULAR | Status: DC | PRN
  Administered 2018-04-20 (×2): 2 mL

## 2018-04-20 MED ORDER — MIDAZOLAM HCL 2 MG/2ML IJ SOLN
INTRAMUSCULAR | Status: DC | PRN
  Administered 2018-04-20: 2 mg via INTRAVENOUS
  Administered 2018-04-20: 1 mg via INTRAVENOUS

## 2018-04-20 MED ORDER — ACETAMINOPHEN 325 MG PO TABS: 650.0000 mg | ORAL_TABLET | ORAL | Status: DC | PRN

## 2018-04-20 MED ORDER — SODIUM CHLORIDE 0.9% FLUSH: 3.0000 mL | Freq: Two times a day (BID) | INTRAVENOUS | Status: DC

## 2018-04-20 MED ORDER — SODIUM CHLORIDE 0.9 % WEIGHT BASED INFUSION
3.0000 mL/kg/h | INTRAVENOUS | Status: AC
  Administered 2018-04-20: 3 mL/kg/h via INTRAVENOUS

## 2018-04-20 MED ORDER — LIDOCAINE HCL (PF) 1 % IJ SOLN
INTRAMUSCULAR | Status: AC
  Filled 2018-04-20: qty 30

## 2018-04-20 MED ORDER — SODIUM CHLORIDE 0.9 % IV SOLN: 250.0000 mL | INTRAVENOUS | Status: DC | PRN

## 2018-04-20 MED ORDER — ASPIRIN 81 MG PO CHEW
CHEWABLE_TABLET | ORAL | Status: AC
  Filled 2018-04-20: qty 1

## 2018-04-20 MED ORDER — HEPARIN (PORCINE) IN NACL 1000-0.9 UT/500ML-% IV SOLN
INTRAVENOUS | Status: AC
  Filled 2018-04-20: qty 500

## 2018-04-20 MED ORDER — ONDANSETRON HCL 4 MG/2ML IJ SOLN: 4.0000 mg | Freq: Four times a day (QID) | INTRAMUSCULAR | Status: DC | PRN

## 2018-04-20 MED ORDER — FENTANYL CITRATE (PF) 100 MCG/2ML IJ SOLN
INTRAMUSCULAR | Status: DC | PRN
  Administered 2018-04-20: 25 ug via INTRAVENOUS

## 2018-04-20 MED ORDER — ASPIRIN 81 MG PO CHEW: 81.0000 mg | CHEWABLE_TABLET | ORAL | Status: DC

## 2018-04-20 MED ORDER — SODIUM CHLORIDE 0.9% FLUSH: 3.0000 mL | INTRAVENOUS | Status: DC | PRN

## 2018-04-20 SURGICAL SUPPLY — 15 items
CATH BALLN WEDGE 5F 110CM (CATHETERS) ×2 IMPLANT
CATH INFINITI 5 FR JL3.5 (CATHETERS) ×2 IMPLANT
CATH INFINITI JR4 5F (CATHETERS) ×2 IMPLANT
DEVICE RAD COMP TR BAND LRG (VASCULAR PRODUCTS) ×2 IMPLANT
GUIDEWIRE .025 260CM (WIRE) ×2 IMPLANT
GUIDEWIRE INQWIRE 1.5J.035X260 (WIRE) ×1 IMPLANT
INQWIRE 1.5J .035X260CM (WIRE) ×2
KIT HEART LEFT (KITS) ×2 IMPLANT
NEEDLE PERC 21GX4CM (NEEDLE) ×2 IMPLANT
PACK CARDIAC CATHETERIZATION (CUSTOM PROCEDURE TRAY) ×2 IMPLANT
SHEATH RAIN 4/5FR (SHEATH) ×2 IMPLANT
SHEATH RAIN RADIAL 21G 6FR (SHEATH) ×2 IMPLANT
TRANSDUCER W/STOPCOCK (MISCELLANEOUS) ×2 IMPLANT
TUBING CIL FLEX 10 FLL-RA (TUBING) ×2 IMPLANT
VALVE MANIFOLD 3 PORT W/RA/ON (MISCELLANEOUS) ×2 IMPLANT

## 2018-04-20 NOTE — Interval H&P Note (Signed)
History and Physical Interval Note:  04/20/2018 1:02 PM  Devon Novak  has presented today for surgery, with the diagnosis of as  The various methods of treatment have been discussed with the patient and family. After consideration of risks, benefits and other options for treatment, the patient has consented to  Procedure(s): RIGHT/LEFT HEART CATH AND CORONARY ANGIOGRAPHY (N/A) as a surgical intervention .  The patient's history has been reviewed, patient examined, no change in status, stable for surgery.  I have reviewed the patient's chart and labs.  Questions were answered to the patient's satisfaction.     Sherren Mocha

## 2018-04-20 NOTE — Research (Signed)
[place] Informed Consent   Subject Name: Devon Novak  Subject met inclusion and exclusion criteria.  The informed consent form, study requirements and expectations were reviewed with the subject and questions and concerns were addressed prior to the signing of the consent form.  The subject verbalized understanding of the trail requirements.  The subject agreed to participate in the [facility] trial and signed the informed consent.  The informed consent was obtained prior to performance of any protocol-specific procedures for the subject.  A copy of the signed informed consent was given to the subject and a copy was placed in the subject's medical record.  Christena Flake 04/20/2018, 10:50 Jerilynn Mages

## 2018-04-20 NOTE — Discharge Instructions (Signed)

## 2018-04-21 ENCOUNTER — Encounter (HOSPITAL_COMMUNITY): Payer: Self-pay | Admitting: Cardiovascular Disease

## 2018-04-22 ENCOUNTER — Encounter: Payer: Medicare Other | Admitting: Thoracic Surgery (Cardiothoracic Vascular Surgery)

## 2018-04-23 ENCOUNTER — Other Ambulatory Visit: Payer: Self-pay

## 2018-04-23 ENCOUNTER — Encounter: Payer: Self-pay | Admitting: Thoracic Surgery (Cardiothoracic Vascular Surgery)

## 2018-04-23 ENCOUNTER — Institutional Professional Consult (permissible substitution) (INDEPENDENT_AMBULATORY_CARE_PROVIDER_SITE_OTHER): Payer: Medicare Other | Admitting: Thoracic Surgery (Cardiothoracic Vascular Surgery)

## 2018-04-23 VITALS — BP 122/72 | HR 75 | Resp 18 | Ht 69.0 in | Wt 220.0 lb

## 2018-04-23 DIAGNOSIS — I350 Nonrheumatic aortic (valve) stenosis: Secondary | ICD-10-CM

## 2018-04-23 NOTE — Progress Notes (Addendum)
HEART AND [place] SURGERY CONSULTATION REPORT  Referring Provider is Sherren Mocha, MD  Primary Cardiologist is Ezzard Standing, MD PCP is [name], MD  Chief Complaint  Patient presents with  . Aortic Stenosis    new patient, CATH 04/20/2018, CT Chest 04/16/2018    HPI:  Patient has a 72 year old moderately obese male with history of aortic stenosis, hyperlipidemia, and obstructive sleep apnea who has been referred for surgical consultation to discuss treatment options for management of severe symptomatic aortic stenosis.  Patient states that he has known of the presence of a heart murmur for several years.  He has been followed by Dr. Wynonia Lawman and more recently by Dr. Burt Knack for aortic stenosis.  Previous echocardiograms revealed findings suggestive of moderate aortic stenosis and the patient has remained asymptomatic.  Over the past several months patient has developed noticeable progression of symptoms of exertional shortness of breath, chest tightness, and fatigue.  Recent follow-up echocardiogram performed at Dr. Thurman Coyer office on Apr 05, 2018 revealed significant progression of disease with severe aortic stenosis.  Peak velocity across the aortic valve was reported 5.1 m/s corresponding to mean transvalvular gradients estimated 63 mmHg.  Left ventricular systolic function remain normal with ejection fraction calculated 60%.  The patient was seen in follow-up by Dr. Burt Knack and underwent diagnostic cardiac catheterization on Apr 20, 2018.  Catheterization confirmed the presence of severe aortic stenosis with mean transvalvular gradient measured 68 mmHg corresponding to aortic valve area calculated 0.74 cm.  There was widely patent coronary arteries with no significant coronary artery disease.  Right heart pressures were normal.  CT angiography was performed and cardiothoracic surgical consultation was requested.  The  patient is married and lives locally in [place].  He has been retired for several years and also retired from the [employer].  He has remained physically active and healthy all of his adult life.  He enjoys hiking and backpacking, and his recently as last year was hiking on the [place] trail.  He describes a long history of mild symptoms of exertional shortness of breath typically brought on only with more strenuous exertion.  Over the last few months the symptoms have progressed dramatically such that he now gets short of breath and tightness across his chest with moderate level activity.  He has not had any resting shortness of breath, PND, orthopnea, or lower extremity edema.  He has not had palpitations, dizzy spells, nor syncope.  Past Medical History:  Diagnosis Date  . Aortic valve stenosis   . Dyspnea   . Hyperlipidemia   . Obesity   . Sleep apnea     Past Surgical History:  Procedure Laterality Date  . RIGHT/LEFT HEART CATH AND CORONARY ANGIOGRAPHY N/A 04/20/2018   Procedure: RIGHT/LEFT HEART CATH AND CORONARY ANGIOGRAPHY;  Surgeon: Sherren Mocha, MD;  Location: [facility];  Service: Cardiovascular;  Laterality: N/A;    Family History  Problem Relation Age of Onset  . Arrhythmia Sister     Social History   Socioeconomic History  . Marital status: Married    Spouse name: Not on file  . Number of children: Not on file  . Years of education: Not on file  . Highest education level: Not on file  Occupational History  . Occupation: retired  Scientific laboratory technician  . Financial resource strain: Not on file  . Food insecurity:    Worry: Never true    Inability: Never true  .  Transportation needs:    Medical: No    Non-medical: No  Tobacco Use  . Smoking status: Former Smoker    Types: Cigarettes  . Smokeless tobacco: Former Systems developer    Types: Chew    Quit date: 3  . Tobacco comment: quit in 70's  Substance and Sexual Activity  . Alcohol use: Yes    Comment: social    . Drug use: Never  . Sexual activity: Not on file  Lifestyle  . Physical activity:    Days per week: 2 days    Minutes per session: 50 min  . Stress: Only a little  Relationships  . Social connections:    Talks on phone: Not on file    Gets together: Not on file    Attends religious service: Not on file    Active member of club or organization: Not on file    Attends meetings of clubs or organizations: Not on file    Relationship status: Not on file  . Intimate partner violence:    Fear of current or ex partner: No    Emotionally abused: No    Physically abused: No    Forced sexual activity: No  Other Topics Concern  . Not on file  Social History Narrative  . Not on file    Current Outpatient Medications  Medication Sig Dispense Refill  . aspirin EC 81 MG tablet Take 81 mg by mouth daily.    [name] atorvastatin (LIPITOR) 40 MG tablet Take 40 mg by mouth daily.    . Cholecalciferol (VITAMIN D-3) 5000 units TABS Take 5,000 Units by mouth daily.    [name] co-enzyme Q-10 30 MG capsule Take 100 mg by mouth daily.     . Cyanocobalamin (B-12) 2500 MCG TABS Take 2,500 mcg by mouth daily.    . finasteride (PROSCAR) 5 MG tablet Take 5 mg by mouth daily.    . magnesium oxide (MAG-OX) 400 MG tablet Take 400 mg by mouth daily.    . Misc Natural Products (GLUCOSAMINE CHOND MSM FORMULA PO) Take 1 tablet by mouth daily.     . Multiple Vitamins-Minerals (MULTIVITAMIN ADULT PO) Take 1 tablet by mouth daily.    . Omega-3 350 MG CPDR Take 350 mg by mouth daily.    [name] OVER THE COUNTER MEDICATION Take 1 tablet by mouth daily. [facility]    . Pomegranate, Punica granatum, (POMEGRANATE EXTRACT) 250 MG CAPS Take 250 mg by mouth daily.    [name] RANITIDINE HCL PO Take 1 tablet by mouth daily as needed (heartburn).    . Saw Palmetto, Serenoa repens, (SAW PALMETTO PO) Take 1 capsule by mouth daily.     . sildenafil (REVATIO) 20 MG tablet Take 40-60 mg by mouth daily as needed (erectile dysfunction).     .  Tetrahydrozoline HCl (VISINE OP) Apply 1 drop to eye daily as needed (dry eyes).    . TURMERIC PO Take 1 tablet by mouth daily.     [name] zolpidem (AMBIEN) 10 MG tablet Take 10 mg by mouth at bedtime as needed for sleep.     No current facility-administered medications for this visit.     No Known Allergies    Review of Systems:   General:  normal appetite, decreased energy, no weight gain, no weight loss, no fever  Cardiac:  + chest pain with exertion, no chest pain at rest, + SOB with exertion, no resting SOB, no PND, no orthopnea, no palpitations, no arrhythmia, no atrial fibrillation, no  LE edema, no dizzy spells, no syncope  Respiratory:  + exertional shortness of breath, no home oxygen, no productive cough, + dry cough, no bronchitis, no wheezing, no hemoptysis, no asthma, no pain with inspiration or cough, + sleep apnea, "occasional" CPAP at night  GI:   no difficulty swallowing, no reflux, no frequent heartburn, no hiatal hernia, no abdominal pain, no constipation, no diarrhea, no hematochezia, no hematemesis, no melena  GU:   no dysuria,  no frequency, no urinary tract infection, no hematuria, + enlarged prostate, no kidney stones, no kidney disease  Vascular:  no pain suggestive of claudication, no pain in feet, no leg cramps, no varicose veins, no DVT, no non-healing foot ulcer  Neuro:   no stroke, no TIA's, no seizures, no headaches, no temporary blindness one eye,  no slurred speech, no peripheral neuropathy, no chronic pain, no instability of gait, no memory/cognitive dysfunction  Musculoskeletal: no arthritis, no joint swelling, no myalgias, no difficulty walking, normal mobility   Skin:   no rash, no itching, no skin infections, no pressure sores or ulcerations  Psych:   no anxiety, no depression, no nervousness, no unusual recent stress  Eyes:   no blurry vision, no floaters, no recent vision changes, no wears glasses or contacts  ENT:   + hearing loss, no loose or painful teeth,  no dentures, last saw dentist within the past month  Hematologic:  no easy bruising, no abnormal bleeding, no clotting disorder, no frequent epistaxis  Endocrine:  no diabetes, does not check CBG's at home           Physical Exam:   BP 122/72 (BP Location: Right Arm, Patient Position: Sitting, Cuff Size: Normal)   Pulse 75   Resp 18   Ht 5\' 9"  (1.753 m)   Wt 220 lb (99.8 kg)   SpO2 97% Comment: RA  BMI 32.49 kg/m   General:  Mildly obese,  well-appearing  HEENT:  Unremarkable   Neck:   no JVD, no bruits, no adenopathy   Chest:   clear to auscultation, symmetrical breath sounds, no wheezes, no rhonchi   CV:   RRR, grade III/VI crescendo/decrescendo murmur heard best at RSB,  no diastolic murmur  Abdomen:  soft, non-tender, no masses   Extremities:  warm, well-perfused, pulses palpable, no LE edema  Rectal/GU  Deferred  Neuro:   Grossly non-focal and symmetrical throughout  Skin:   Clean and dry, no rashes, no breakdown   Diagnostic Tests:  TRANSTHORACIC ECHOCARDIOGRAM  Both images and report from recent transthoracic echocardiogram performed at Dr. Thurman Coyer office on Apr 05, 2018 have been reviewed.  The patient versus normal left ventricular systolic function.  Aortic valve appears trileaflet with severe thickening, calcification, and restricted leaflet mobility.  Peak velocity across aortic valve measured 5.1 m/s corresponding to mean transvalvular gradient estimated 62.6 mmHg.  Left ventricular ejection fraction was estimated 60%.  There was trace mitral regurgitation.  There is mild to moderate tricuspid regurgitation.    RIGHT/LEFT HEART CATH AND CORONARY ANGIOGRAPHY  Conclusion   1. Widely patent coronary arteries with minimal irregularity (left dominant) 2. Severe aortic stenosis with a mean transvalvular gradient of 68 mmHg and calculated AVA of 0.74 square cm.  3. Normal right heart hemodynamics  Plan: continued multidisciplinary heart valve team evaluation.  Likely surgical aortic valve replacement.  Indications   Severe aortic stenosis [I35.0 (ICD-10-CM)]  Procedural Details/Technique   Technical Details INDICATION: Severe symptomatic aortic stenosis  PROCEDURAL DETAILS: There was an  indwelling IV in a left antecubital vein. Using normal sterile technique, the IV was changed out for a 5 Fr brachial sheath over a 0.018 inch wire. The right wrist was then prepped, draped, and anesthetized with 1% lidocaine. Using the modified Seldinger technique a 5/6 French Slender sheath was placed in the right radial artery. Intra-arterial verapamil was administered through the radial artery sheath. IV heparin was administered after a JR4 catheter was advanced into the central aorta. A Swan-Ganz catheter was used for the right heart catheterization. Standard protocol was followed for recording of right heart pressures and sampling of oxygen saturations. Fick cardiac output was calculated. Standard Judkins catheters were used for selective coronary angiography. LV pressure is recorded with a JR4 catheter. There were no immediate procedural complications. The patient was transferred to the post catheterization recovery area for further monitoring.     Estimated blood loss <50 mL.  During this procedure the patient was administered the following to achieve and maintain moderate conscious sedation: Versed 3 mg, Fentanyl 25 mcg, while the patient's heart rate, blood pressure, and oxygen saturation were continuously monitored. The period of conscious sedation was 29 minutes, of which I was present face-to-face 100% of this time.  Coronary Findings   Diagnostic  Dominance: Left  Left Main  Vessel is large. Vessel is angiographically normal.  Left Anterior Descending  The vessel exhibits minimal luminal irregularities.  First Diagonal Branch  The vessel exhibits minimal luminal irregularities.  Left Circumflex  Vessel is angiographically normal. Dominant circumflex,  large vessel, angiographically normal  Right Coronary Artery  High anterior origin. Small, nondominant vessel.  Intervention   No interventions have been documented.  Left Heart   Aortic Valve There is severe aortic valve stenosis. The aortic valve is calcified. There is restricted aortic valve motion.  Coronary Diagrams   Diagnostic Diagram       Implants    No implant documentation for this case.  MERGE Images   Show images for CARDIAC CATHETERIZATION   Link to Procedure Log   Procedure Log    Hemo Data    Most Recent Value  Fick Cardiac Output 5.82 L/min  Fick Cardiac Output Index 2.71 (L/min)/BSA  Aortic Mean Gradient 67.9 mmHg  Aortic Peak Gradient 73 mmHg  Aortic Valve Area 0.74  Aortic Value Area Index 0.34 cm2/BSA  RA A Wave 12 mmHg  RA V Wave 10 mmHg  RA Mean 8 mmHg  RV Systolic Pressure 35 mmHg  RV Diastolic Pressure 4 mmHg  RV EDP 11 mmHg  PA Systolic Pressure 36 mmHg  PA Diastolic Pressure 16 mmHg  PA Mean 25 mmHg  PW A Wave 24 mmHg  PW V Wave 22 mmHg  PW Mean 17 mmHg  AO Systolic Pressure 409 mmHg  AO Diastolic Pressure 76 mmHg  AO Mean 96 mmHg  LV Systolic Pressure 811 mmHg  LV Diastolic Pressure 10 mmHg  LV EDP 16 mmHg  Arterial Occlusion Pressure Extended Systolic Pressure 914 mmHg  Arterial Occlusion Pressure Extended Diastolic Pressure 77 mmHg  Arterial Occlusion Pressure Extended Mean Pressure 101 mmHg  Left Ventricular Apex Extended Systolic Pressure 782 mmHg  Left Ventricular Apex Extended Diastolic Pressure 10 mmHg  Left Ventricular Apex Extended EDP Pressure 16 mmHg  QP/QS 1  TPVR Index 9.24 HRUI  TSVR Index 35.48 HRUI  PVR SVR Ratio 0.09  TPVR/TSVR Ratio 0.26     Cardiac TAVR CT  TECHNIQUE: The patient was scanned on a Siemens 956 slice scanner. A 120 kV  retrospective scan was triggered in the descending thoracic aorta at 111 HU's. Gantry rotation speed was 270 msecs and collimation was .9 mm. No beta blockade or nitro  were given. The 3D data set was reconstructed in 5% intervals of the R-R cycle. Systolic and diastolic phases were analyzed on a dedicated work station using MPR, MIP and VRT modes. The patient received 80 cc of contrast.  FINDINGS: Aortic Valve: Calcified and tri leaflet with restricted motion  Aorta: Mild atherosclerotic plaque normal arch vessel anatomy no aneurysm  Sinotubular Junction: 31.5 mm  Ascending Thoracic Aorta: 37 mm  Aortic Arch: 31 mm  Descending Thoracic Aorta: 25 mm  Sinus of Valsalva Measurements:  Non-coronary: 31 mm  Right -coronary: 31 mm  Left -coronary: 28 mm  Coronary Artery Height above Annulus:  Left Main: 13 mm above annulus  Right Coronary: 13.7 mm above annulus  Virtual Basal Annulus Measurements:  Maximum/Minimum Diameter: 20.5 mm x 26.2 mm  Perimeter: 76 mm  Area: 446 mm2  Coronary Arteries: Sufficient height above annulus for deployment  Optimum Fluoroscopic Angle for Delivery: LAO 19 Caudal 21 degrees  IMPRESSION: 1. Calcified tri leaflet AV with annular area of 446 mm2 suitable for a 26 mm Sapien 3 valve  2.  Optimum angiographic angle for delivery LAO 19 Caudal 21 degrees  3.  Coronary arteries suitable height above annulus for deployment  4.  Normal aortic root 3.7 cm with minimal atherosclerotic disease  Jenkins Rouge   Electronically Signed   By: Jenkins Rouge M.D.   On: 04/16/2018 16:10   CT ANGIOGRAPHY CHEST, ABDOMEN AND PELVIS  TECHNIQUE: Multidetector CT imaging through the chest, abdomen and pelvis was performed using the standard protocol during bolus administration of intravenous contrast. Multiplanar reconstructed images and MIPs were obtained and reviewed to evaluate the vascular anatomy.  CONTRAST:  174mL ISOVUE-370 IOPAMIDOL (ISOVUE-370) INJECTION 76%  COMPARISON:  None.  FINDINGS: CTA CHEST FINDINGS  Cardiovascular: Heart size is mildly enlarged with  concentric left ventricular hypertrophy. There is no significant pericardial fluid, thickening or pericardial calcification. Aortic atherosclerosis (mild). No definite coronary artery calcifications. Severe thickening calcification of the aortic valve. Ectasia of ascending thoracic aorta which measures up to 4.0 cm in diameter.  Mediastinum/Lymph Nodes: No pathologically enlarged mediastinal or hilar lymph nodes. Small hiatal hernia. No axillary lymphadenopathy.  Lungs/Pleura: Tiny calcified granuloma in the periphery of the right lower lobe. No other suspicious appearing pulmonary nodules or masses are noted. Mild scarring in the inferior segment of the lingula. No acute consolidative airspace disease. No pleural effusions.  Musculoskeletal/Soft Tissues: No aggressive appearing lytic or blastic lesions are noted in the visualized portions of the skeleton.  CTA ABDOMEN AND PELVIS FINDINGS  Hepatobiliary: No suspicious cystic or solid hepatic lesions. No intra or extrahepatic biliary ductal dilatation. Gallbladder is normal in appearance.  Pancreas: No pancreatic mass. No pancreatic ductal dilatation. No pancreatic or peripancreatic fluid or inflammatory changes.  Spleen: Unremarkable.  Adrenals/Urinary Tract: Bilateral kidneys and adrenal glands are normal in appearance. No hydroureteronephrosis. Urinary bladder is normal in appearance.  Stomach/Bowel: Normal appearance of the stomach. No pathologic dilatation of small bowel or colon. Normal appendix.  Vascular/Lymphatic: Aortic atherosclerosis, without evidence of aneurysm or dissection in the abdominal or pelvic vasculature. Vascular findings and measurements pertinent to potential TAVR procedure, as detailed below. Celiac axis, superior mesenteric artery and inferior mesenteric artery are all widely patent without hemodynamically significant stenosis. Two right-sided and 2 left-sided renal arteries are widely  patent without hemodynamically  significant stenosis. Circumaortic left renal vein (normal anatomical variant) incidentally noted. No lymphadenopathy noted in the abdomen or pelvis.  Reproductive: Prostate gland and seminal vesicles are unremarkable in appearance.  Other: No significant volume of ascites.  No pneumoperitoneum.  Musculoskeletal: There are no aggressive appearing lytic or blastic lesions noted in the visualized portions of the skeleton.  VASCULAR MEASUREMENTS PERTINENT TO TAVR:  AORTA:  Minimal Aortic Diameter-17 x 18 mm  Severity of Aortic Calcification-mild  RIGHT PELVIS:  Right Common Iliac Artery -  Minimal Diameter-12.2 x 11.3 mm  Tortuosity-mild  Calcification-none  Right External Iliac Artery -  Minimal Diameter-7.9 x 7.6 mm  Tortuosity-severe  Calcification - none  Right Common Femoral Artery -  Minimal Diameter-9.5 x 10.0 mm  Tortuosity-mild  Calcification - none  LEFT PELVIS:  Left Common Iliac Artery -  Minimal Diameter-11.0 x 11.8 mm  Tortuosity-mild  Calcification - none  Left External Iliac Artery -  Minimal Diameter-8.1 x 7.6 mm  Tortuosity-severe  Calcification - none  Left Common Femoral Artery -  Minimal Diameter-9.0 x 8.5 mm  Tortuosity-mild  Calcification - none  Review of the MIP images confirms the above findings.  IMPRESSION: 1. Vascular findings and measurements pertinent to potential TAVR procedure, as detailed above. 2. Severe thickening calcification of the aortic valve, compatible with the reported clinical history of severe aortic stenosis. 3. This is associated with concentric left ventricular hypertrophy. 4. Aortic atherosclerosis, with ectasia of the ascending thoracic aorta which measures up to 4.0 cm in diameter. Recommend annual imaging followup by CTA or MRA. This recommendation follows 2010 ACCF/AHA/AATS/ACR/ASA/SCA/SCAI/SIR/STS/SVM Guidelines for  the Diagnosis and Management of Patients with Thoracic Aortic Disease. Circulation. 2010; 121: [id number] 5. Additional incidental findings, as above.  Aortic Atherosclerosis (ICD10-I70.0).   Electronically Signed   By: Vinnie Langton M.D.   On: 04/16/2018 15:56   STS Risk Calculator  Procedure: Isolated AVR CALCULATE   Risk of Mortality:  0.575% Renal Failure:  0.751% Permanent Stroke:  0.829% Prolonged Ventilation:  3.185% DSW Infection:  0.116% Reoperation:  2.657% Morbidity or Mortality:  6.147% Short Length of Stay:  59.939% Long Length of Stay:  1.809%   Impression:  Patient has stage D severe symptomatic aortic stenosis.  He describes progressive symptoms of exertional shortness of breath and fatigue consistent with chronic diastolic congestive heart failure, [place] Heart Association functional class II.  I personally reviewed the patient's recent transthoracic echocardiogram, diagnostic cardiac catheterization, and CT angiogram.  Echocardiogram reveals the presence of a trileaflet aortic valve with severe aortic stenosis.  There is normal left ventricular systolic function.  Diagnostic cardiac catheterization confirmed the presence of severe aortic stenosis and was notable for the absence of significant coronary artery disease.  Cardiac gated CT angiogram of the heart confirms anatomical characteristics consistent with aortic stenosis.  There is no significant aneurysmal enlargement of the aortic root or ascending thoracic aorta.  Risks associated with conventional surgery should be relatively low.  Patient appears to be an acceptable candidate for minimally invasive approach for surgery without any significant complicating features.   Plan:  The patient and his wife were counseled at length regarding treatment alternatives for management of severe aortic stenosis including continued medical therapy versus proceeding with aortic valve replacement in the  near future.  The natural history of aortic stenosis was reviewed, as was long term prognosis with medical therapy alone.  Surgical options were discussed at length including conventional surgical aortic valve replacement through either a full median sternotomy  or using minimally invasive techniques.  Other alternatives including rapid-deployment bioprosthetic tissue valve replacement, transcatheter aortic valve replacement, and patch enlargement of the aortic root were discussed.  Discussion was held comparing the relative risks of mechanical valve replacement with need for lifelong anticoagulation versus use of a bioprosthetic tissue valve and the associated potential for late structural valve deterioration and failure.  This discussion was placed in the context of the patient's particular circumstances, and as a result the patient specifically requests that their valve be replaced using a bioprosthetic tissue valve.  We tentatively plan to proceed with surgery on May 18, 2018.  The patient will return to our office for follow-up prior to surgery on May 17, 2018.   I spent in excess of 90 minutes during the conduct of this office consultation and >50% of this time involved direct face-to-face encounter with the patient for counseling and/or coordination of their care.    Valentina Gu. Roxy Manns, MD 04/23/2018 2:35 PM

## 2018-04-23 NOTE — Patient Instructions (Signed)
Stop taking aspirin and any over the counter supplements other than a multivitamin  Continue taking all other medications without change through the day before surgery.  Have nothing to eat or drink after midnight the night before surgery.  On the morning of surgery do not take any medications

## 2018-04-26 ENCOUNTER — Other Ambulatory Visit: Payer: Self-pay

## 2018-04-26 ENCOUNTER — Other Ambulatory Visit: Payer: Self-pay | Admitting: *Deleted

## 2018-04-26 DIAGNOSIS — I350 Nonrheumatic aortic (valve) stenosis: Secondary | ICD-10-CM

## 2018-05-04 ENCOUNTER — Encounter: Payer: Self-pay | Admitting: Thoracic Surgery (Cardiothoracic Vascular Surgery)

## 2018-05-05 ENCOUNTER — Ambulatory Visit (HOSPITAL_COMMUNITY): Payer: Medicare Other

## 2018-05-13 NOTE — Pre-Procedure Instructions (Signed)
Devon Novak  05/13/2018      [facility] [address] [place] [address] [place] [address] Phone: [phone] Fax: [phone]    Your procedure is scheduled on June 25  Report to [facility] at 5:30 A.M.  Call this number if you have problems the morning of surgery:  [phone]   Remember:   Do not eat or drink after midnight.                         Take these medicines the morning of surgery with A SIP OF WATER :              Eye drops if needed             Ranitidine (zantac) if needed                          7 days prior to surgery STOP  Aleve, Naproxen, Ibuprofen, Motrin, Advil, Goody's, BC's, all herbal medications, fish oil, and all vitamins               Follow your doctors instructions regarding your Aspirin.  If no instructions were given by your doctor, then you will need to call the prescribing office office to get instructions.      Do not wear jewelry.  Do not wear lotions, powders, or perfumes, or deodorant.  Do not shave 48 hours prior to surgery.  Men may shave face and neck.  Do not bring valuables to the hospital.  [facility] is not responsible for any belongings or valuables.  Contacts, dentures or bridgework may not be worn into surgery.  Leave your suitcase in the car.  After surgery it may be brought to your room.  For patients admitted to the hospital, discharge time will be determined by your treatment team.  Patients discharged the day of surgery will not be allowed to drive home.    Special instructions:  [facility]- Preparing For Surgery  Before surgery, you can play an important role. Because skin is not sterile, your skin needs to be as free of germs as possible. You can reduce the number of germs on your skin by washing with CHG (chlorahexidine gluconate) Soap before surgery.  CHG is an antiseptic cleaner which kills germs and bonds with the skin to  continue killing germs even after washing.    Oral Hygiene is also important to reduce your risk of infection.  Remember - BRUSH YOUR TEETH THE MORNING OF SURGERY WITH YOUR REGULAR TOOTHPASTE  Please do not use if you have an allergy to CHG or antibacterial soaps. If your skin becomes reddened/irritated stop using the CHG.  Do not shave (including legs and underarms) for at least 48 hours prior to first CHG shower. It is OK to shave your face.  Please follow these instructions carefully.   1. Shower the NIGHT BEFORE SURGERY and the MORNING OF SURGERY with CHG.   2. If you chose to wash your hair, wash your hair first as usual with your normal shampoo.  3. After you shampoo, rinse your hair and body thoroughly to remove the shampoo.  4. Use CHG as you would any other liquid soap. You can apply CHG directly to the skin and wash gently with a scrungie or a clean washcloth.   5. Apply the CHG  Soap to your body ONLY FROM THE NECK DOWN.  Do not use on open wounds or open sores. Avoid contact with your eyes, ears, mouth and genitals (private parts). Wash Face and genitals (private parts)  with your normal soap.  6. Wash thoroughly, paying special attention to the area where your surgery will be performed.  7. Thoroughly rinse your body with warm water from the neck down.  8. DO NOT shower/wash with your normal soap after using and rinsing off the CHG Soap.  9. Pat yourself dry with a CLEAN TOWEL.  10. Wear CLEAN PAJAMAS to bed the night before surgery, wear comfortable clothes the morning of surgery  11. Place CLEAN SHEETS on your bed the night of your first shower and DO NOT SLEEP WITH PETS.    Day of Surgery:  Do not apply any deodorants/lotions.  Please wear clean clothes to the hospital/surgery center.   Remember to brush your teeth WITH YOUR REGULAR TOOTHPASTE.    Please read over the following fact sheets that you were given. Coughing and Deep Breathing, MRSA Information and  Surgical Site Infection Prevention

## 2018-05-14 ENCOUNTER — Other Ambulatory Visit: Payer: Self-pay

## 2018-05-14 ENCOUNTER — Other Ambulatory Visit (HOSPITAL_COMMUNITY): Payer: Medicare Other

## 2018-05-14 ENCOUNTER — Encounter (HOSPITAL_COMMUNITY)
Admission: RE | Admit: 2018-05-14 | Discharge: 2018-05-14 | Disposition: A | Discharge status: Home / Self Care | Payer: Medicare Other | Source: Ambulatory Visit | Attending: Thoracic Surgery (Cardiothoracic Vascular Surgery) | Admitting: Thoracic Surgery (Cardiothoracic Vascular Surgery)

## 2018-05-14 ENCOUNTER — Ambulatory Visit (HOSPITAL_COMMUNITY)
Admission: RE | Admit: 2018-05-14 | Discharge: 2018-05-14 | Disposition: A | Discharge status: Home / Self Care | Payer: Medicare Other | Source: Ambulatory Visit | Attending: Thoracic Surgery (Cardiothoracic Vascular Surgery) | Admitting: Thoracic Surgery (Cardiothoracic Vascular Surgery)

## 2018-05-14 ENCOUNTER — Encounter (HOSPITAL_COMMUNITY): Payer: Self-pay

## 2018-05-14 ENCOUNTER — Ambulatory Visit (HOSPITAL_BASED_OUTPATIENT_CLINIC_OR_DEPARTMENT_OTHER)
Admission: RE | Admit: 2018-05-14 | Discharge: 2018-05-14 | Disposition: A | Discharge status: Home / Self Care | Payer: Medicare Other | Source: Ambulatory Visit | Attending: Thoracic Surgery (Cardiothoracic Vascular Surgery) | Admitting: Thoracic Surgery (Cardiothoracic Vascular Surgery)

## 2018-05-14 DIAGNOSIS — R9431 Abnormal electrocardiogram [ECG] [EKG]: Secondary | ICD-10-CM | POA: Insufficient documentation

## 2018-05-14 DIAGNOSIS — I350 Nonrheumatic aortic (valve) stenosis: Secondary | ICD-10-CM | POA: Insufficient documentation

## 2018-05-14 DIAGNOSIS — I6523 Occlusion and stenosis of bilateral carotid arteries: Secondary | ICD-10-CM | POA: Insufficient documentation

## 2018-05-14 DIAGNOSIS — Z01818 Encounter for other preprocedural examination: Secondary | ICD-10-CM | POA: Diagnosis not present

## 2018-05-14 HISTORY — DX: Lyme disease, unspecified: A69.20

## 2018-05-14 HISTORY — DX: Unspecified asthma, uncomplicated: J45.909

## 2018-05-14 HISTORY — DX: Inflammatory liver disease, unspecified: K75.9

## 2018-05-14 HISTORY — DX: Gastro-esophageal reflux disease without esophagitis: K21.9

## 2018-05-14 HISTORY — DX: Personal history of other diseases of the digestive system: Z87.19

## 2018-05-14 LAB — PULMONARY FUNCTION TEST
DL/VA % pred: 91 %
DL/VA: 4.14 ml/min/mmHg/L
DLCO UNC % PRED: 81 %
DLCO UNC: 25.38 ml/min/mmHg
FEF 25-75 PRE: 2.8 L/s
FEF 25-75 Post: 3.91 L/sec
FEF2575-%Change-Post: 39 %
FEF2575-%PRED-POST: 168 %
FEF2575-%PRED-PRE: 120 %
FEV1-%Change-Post: 7 %
FEV1-%PRED-POST: 104 %
FEV1-%Pred-Pre: 97 %
FEV1-POST: 3.23 L
FEV1-Pre: 3.01 L
FEV1FVC-%Change-Post: 1 %
FEV1FVC-%Pred-Pre: 109 %
FEV6-%CHANGE-POST: 5 %
FEV6-%PRED-POST: 99 %
FEV6-%Pred-Pre: 93 %
FEV6-Post: 3.95 L
FEV6-Pre: 3.73 L
FEV6FVC-%CHANGE-POST: 0 %
FEV6FVC-%PRED-PRE: 105 %
FEV6FVC-%Pred-Post: 105 %
FVC-%Change-Post: 5 %
FVC-%Pred-Post: 94 %
FVC-%Pred-Pre: 89 %
FVC-Post: 3.97 L
FVC-Pre: 3.77 L
POST FEV1/FVC RATIO: 81 %
PRE FEV1/FVC RATIO: 80 %
Post FEV6/FVC ratio: 99 %
Pre FEV6/FVC Ratio: 99 %
RV % PRED: 114 %
RV: 2.76 L
TLC % pred: 101 %
TLC: 6.94 L

## 2018-05-14 LAB — COMPREHENSIVE METABOLIC PANEL
ALT: 22 U/L (ref 17–63)
AST: 28 U/L (ref 15–41)
Albumin: 4 g/dL (ref 3.5–5.0)
Alkaline Phosphatase: 59 U/L (ref 38–126)
Anion gap: 9 (ref 5–15)
BUN: 16 mg/dL (ref 6–20)
CO2: 21 mmol/L — ABNORMAL LOW (ref 22–32)
Calcium: 9 mg/dL (ref 8.9–10.3)
Chloride: 110 mmol/L (ref 101–111)
Creatinine, Ser: 1.05 mg/dL (ref 0.61–1.24)
Glucose, Bld: 121 mg/dL — ABNORMAL HIGH (ref 65–99)
POTASSIUM: 4 mmol/L (ref 3.5–5.1)
Sodium: 140 mmol/L (ref 135–145)
Total Bilirubin: 1 mg/dL (ref 0.3–1.2)
Total Protein: 6.4 g/dL — ABNORMAL LOW (ref 6.5–8.1)

## 2018-05-14 LAB — CBC
HEMATOCRIT: 42.6 % (ref 39.0–52.0)
Hemoglobin: 14.7 g/dL (ref 13.0–17.0)
MCH: 30.2 pg (ref 26.0–34.0)
MCHC: 34.5 g/dL (ref 30.0–36.0)
MCV: 87.7 fL (ref 78.0–100.0)
Platelets: 170 10*3/uL (ref 150–400)
RBC: 4.86 MIL/uL (ref 4.22–5.81)
RDW: 13.3 % (ref 11.5–15.5)
WBC: 5.7 10*3/uL (ref 4.0–10.5)

## 2018-05-14 LAB — URINALYSIS, ROUTINE W REFLEX MICROSCOPIC
Bilirubin Urine: NEGATIVE
GLUCOSE, UA: NEGATIVE mg/dL
Hgb urine dipstick: NEGATIVE
Ketones, ur: NEGATIVE mg/dL
LEUKOCYTES UA: NEGATIVE
Nitrite: NEGATIVE
PROTEIN: NEGATIVE mg/dL
Specific Gravity, Urine: 1.018 (ref 1.005–1.030)
pH: 5 (ref 5.0–8.0)

## 2018-05-14 LAB — BLOOD GAS, ARTERIAL
ACID-BASE DEFICIT: 1.1 mmol/L (ref 0.0–2.0)
BICARBONATE: 22.7 mmol/L (ref 20.0–28.0)
Drawn by: 449841
FIO2: 21
O2 SAT: 94.8 %
PH ART: 7.427 (ref 7.350–7.450)
PO2 ART: 72.9 mmHg — AB (ref 83.0–108.0)
Patient temperature: 98.6
pCO2 arterial: 35 mmHg (ref 32.0–48.0)

## 2018-05-14 LAB — PROTIME-INR
INR: 1.07
Prothrombin Time: 13.8 seconds (ref 11.4–15.2)

## 2018-05-14 LAB — APTT: aPTT: 30 seconds (ref 24–36)

## 2018-05-14 LAB — HEMOGLOBIN A1C
HEMOGLOBIN A1C: 5.4 % (ref 4.8–5.6)
Mean Plasma Glucose: 108.28 mg/dL

## 2018-05-14 LAB — SURGICAL PCR SCREEN
MRSA, PCR: NEGATIVE
Staphylococcus aureus: NEGATIVE

## 2018-05-14 LAB — ABO/RH: ABO/RH(D): A NEG

## 2018-05-14 MED ORDER — ALBUTEROL SULFATE (2.5 MG/3ML) 0.083% IN NEBU
2.5000 mg | INHALATION_SOLUTION | Freq: Once | RESPIRATORY_TRACT | Status: AC
  Administered 2018-05-14: 2.5 mg via RESPIRATORY_TRACT

## 2018-05-14 NOTE — Progress Notes (Signed)
Pre-AVR testing has been completed. 1-39% ICA stenosis bilaterally.  Palmar waveforms Right Palmar waveforms are obliterated with radial comperssion and remain within normal limits with ulnar compression. Left Palmar waveforms are obliterated with radial and ulnar compression.  05/14/18 12:32 PM Devon Novak RVT

## 2018-05-14 NOTE — Progress Notes (Signed)
PCP - Dr. [name]  Cardiologist - Dr. Wynonia Lawman  Chest x-ray - 05/14/2018 EKG - 05/14/2018 Stress Test - 20+ years ago ECHO - 04/05/2018 Cardiac Cath -04/20/2018   Sleep Study - Patient has had a positive sleep study. CPAP - denies using a CPAP  Aspirin Instructions: On hold for surgery. Patient states he hasn't taken ASA since May 2019.  Anesthesia review: Yes  Patient denies shortness of breath, fever, cough and chest pain at PAT appointment   Patient verbalized understanding of instructions that were given to them at the PAT appointment. Patient was also instructed that they will need to review over the PAT instructions again at home before surgery.  Jacqlyn Larsen, RN

## 2018-05-17 ENCOUNTER — Encounter (HOSPITAL_COMMUNITY): Payer: Self-pay | Admitting: Certified Registered Nurse Anesthetist

## 2018-05-17 ENCOUNTER — Other Ambulatory Visit: Payer: Self-pay

## 2018-05-17 ENCOUNTER — Ambulatory Visit (INDEPENDENT_AMBULATORY_CARE_PROVIDER_SITE_OTHER): Payer: Medicare Other | Admitting: Thoracic Surgery (Cardiothoracic Vascular Surgery)

## 2018-05-17 ENCOUNTER — Encounter: Payer: Self-pay | Admitting: Thoracic Surgery (Cardiothoracic Vascular Surgery)

## 2018-05-17 VITALS — BP 126/73 | HR 72 | Resp 18 | Ht 69.0 in | Wt 222.8 lb

## 2018-05-17 DIAGNOSIS — I350 Nonrheumatic aortic (valve) stenosis: Secondary | ICD-10-CM | POA: Diagnosis not present

## 2018-05-17 MED ORDER — TRANEXAMIC ACID 1000 MG/10ML IV SOLN
1.5000 mg/kg/h | INTRAVENOUS | Status: AC
  Administered 2018-05-18: 1.5 mg/kg/h via INTRAVENOUS
  Filled 2018-05-17: qty 25

## 2018-05-17 MED ORDER — SODIUM CHLORIDE 0.9 % IV SOLN
750.0000 mg | INTRAVENOUS | Status: DC
  Filled 2018-05-17: qty 750

## 2018-05-17 MED ORDER — TRANEXAMIC ACID (OHS) PUMP PRIME SOLUTION
2.0000 mg/kg | INTRAVENOUS | Status: DC
  Filled 2018-05-17: qty 2.02

## 2018-05-17 MED ORDER — KENNESTONE BLOOD CARDIOPLEGIA VIAL
13.0000 mL | Freq: Once | Status: DC
  Filled 2018-05-17: qty 13

## 2018-05-17 MED ORDER — DEXMEDETOMIDINE HCL IN NACL 400 MCG/100ML IV SOLN
0.1000 ug/kg/h | INTRAVENOUS | Status: AC
  Administered 2018-05-18: .3 ug/kg/h via INTRAVENOUS
  Filled 2018-05-17: qty 100

## 2018-05-17 MED ORDER — POTASSIUM CHLORIDE 2 MEQ/ML IV SOLN
80.0000 meq | INTRAVENOUS | Status: DC
  Filled 2018-05-17: qty 40

## 2018-05-17 MED ORDER — VANCOMYCIN HCL 10 G IV SOLR
1500.0000 mg | INTRAVENOUS | Status: AC
  Administered 2018-05-18: 1500 mg via INTRAVENOUS
  Filled 2018-05-17: qty 1500

## 2018-05-17 MED ORDER — KENNESTONE BLOOD CARDIOPLEGIA (KBC) MANNITOL SYRINGE (20%, 32ML)
32.0000 mL | Freq: Once | INTRAVENOUS | Status: DC
  Filled 2018-05-17: qty 32

## 2018-05-17 MED ORDER — SODIUM CHLORIDE 0.9 % IV SOLN
30.0000 ug/min | INTRAVENOUS | Status: DC
  Filled 2018-05-17: qty 2

## 2018-05-17 MED ORDER — EPINEPHRINE PF 1 MG/ML IJ SOLN
0.0000 ug/min | INTRAVENOUS | Status: DC
  Filled 2018-05-17: qty 4

## 2018-05-17 MED ORDER — PAPAVERINE HCL 30 MG/ML IJ SOLN
INTRAMUSCULAR | Status: DC
  Filled 2018-05-17: qty 2.5

## 2018-05-17 MED ORDER — SODIUM CHLORIDE 0.9 % IV SOLN
INTRAVENOUS | Status: AC
  Administered 2018-05-18: .5 [IU]/h via INTRAVENOUS
  Filled 2018-05-17: qty 1

## 2018-05-17 MED ORDER — VANCOMYCIN HCL 1000 MG IV SOLR
INTRAVENOUS | Status: AC
  Administered 2018-05-18: 1000 mL
  Filled 2018-05-17: qty 1000

## 2018-05-17 MED ORDER — SODIUM CHLORIDE 0.9 % IV SOLN
1.5000 g | INTRAVENOUS | Status: AC
  Administered 2018-05-18: .75 g via INTRAVENOUS
  Administered 2018-05-18: 1.5 g via INTRAVENOUS
  Filled 2018-05-17: qty 1.5

## 2018-05-17 MED ORDER — MILRINONE LACTATE IN DEXTROSE 20-5 MG/100ML-% IV SOLN
0.1250 ug/kg/min | INTRAVENOUS | Status: DC
  Filled 2018-05-17: qty 100

## 2018-05-17 MED ORDER — DOPAMINE-DEXTROSE 3.2-5 MG/ML-% IV SOLN
0.0000 ug/kg/min | INTRAVENOUS | Status: DC
  Filled 2018-05-17: qty 250

## 2018-05-17 MED ORDER — HEPARIN SODIUM (PORCINE) 1000 UNIT/ML IJ SOLN
INTRAMUSCULAR | Status: DC
  Filled 2018-05-17: qty 30

## 2018-05-17 MED ORDER — TRANEXAMIC ACID (OHS) BOLUS VIA INFUSION
15.0000 mg/kg | INTRAVENOUS | Status: AC
Start: 2018-05-18 — End: 2018-05-18
  Administered 2018-05-18: 1518 mg via INTRAVENOUS
  Filled 2018-05-17: qty 1518

## 2018-05-17 MED ORDER — MAGNESIUM SULFATE 50 % IJ SOLN
40.0000 meq | INTRAMUSCULAR | Status: DC
  Filled 2018-05-17: qty 9.85

## 2018-05-17 MED ORDER — NITROGLYCERIN IN D5W 200-5 MCG/ML-% IV SOLN
2.0000 ug/min | INTRAVENOUS | Status: DC
  Filled 2018-05-17: qty 250
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
## 2018-05-17 NOTE — Patient Instructions (Addendum)
   Continue taking all current medications without change through the day before surgery.  Have nothing to eat or drink after midnight the night before surgery.  Do not take any medications in the morning prior to surgery

## 2018-05-17 NOTE — Progress Notes (Signed)
[address]       [place] [address]             [facility] OFFICE NOTE  Referring Provider is Sherren Mocha, MD  Primary Cardiologist is Ezzard Standing, MD PCP is [name], MD   HPI:  Patient returns the office today with tentative plans to proceed with aortic valve replacement in the operating room tomorrow.  He was originally seen in consultation on Apr 23, 2018.  He reports no new problems or complaints over the last few weeks and he is eager to get his surgery behind him.  He does note that he continues to experience some exertional fatigue and shortness of breath.   Current Outpatient Medications  Medication Sig Dispense Refill  . aspirin EC 81 MG tablet Take 81 mg by mouth daily.    [name] atorvastatin (LIPITOR) 40 MG tablet Take 40 mg by mouth every evening.     . Cholecalciferol (VITAMIN D-3) 5000 units TABS Take 5,000 Units by mouth daily.    . Coenzyme Q10 (CO Q10) 100 MG CAPS Take 100 mg by mouth daily.    . Cyanocobalamin (B-12) 2500 MCG TABS Take 2,500 mcg by mouth daily.    . finasteride (PROSCAR) 5 MG tablet Take 5 mg by mouth every evening.     [name] ibuprofen (ADVIL,MOTRIN) 200 MG tablet Take 400-600 mg by mouth daily as needed for headache or moderate pain.    . magnesium oxide (MAG-OX) 400 MG tablet Take 400 mg by mouth daily.    . Misc Natural Products (GLUCOSAMINE CHOND MSM FORMULA PO) Take 1 tablet by mouth daily.     . Multiple Vitamins-Minerals (MULTIVITAMIN ADULT PO) Take 1 tablet by mouth daily.    . Omega-3 350 MG CPDR Take 350 mg by mouth daily.    . Polyvinyl Alcohol (LIQUID TEARS OP) Place 1 drop into both eyes daily as needed (dry eyes).    . Pomegranate, Punica granatum, (POMEGRANATE EXTRACT) 250 MG CAPS Take 250 mg by mouth daily.    . Probiotic CAPS Take 1 capsule by mouth daily.    . ranitidine (ZANTAC) 150 MG tablet Take 150 mg by mouth daily as needed for heartburn.    . Saw Palmetto,  Serenoa repens, (SAW PALMETTO PO) Take 1 capsule by mouth daily.     . sildenafil (REVATIO) 20 MG tablet Take 40-60 mg by mouth daily as needed (erectile dysfunction).     . Tetrahydrozoline HCl (VISINE OP) Apply 1 drop to eye daily as needed (dry eyes).    . TURMERIC PO Take 1 tablet by mouth daily.     [name] zolpidem (AMBIEN) 10 MG tablet Take 5 mg by mouth at bedtime as needed for sleep.      No current facility-administered medications for this visit.    Facility-Administered Medications Ordered in Other Visits  Medication Dose Route Frequency Provider Last Rate Last Dose  . [START ON 05/18/2018] cefUROXime (ZINACEF) 1.5 g in sodium chloride 0.9 % 100 mL IVPB  1.5 g Intravenous To OR Rexene Alberts, MD      . Derrill Memo ON 05/18/2018] cefUROXime (ZINACEF) 750 mg in sodium chloride 0.9 % 100 mL IVPB  750 mg Intravenous To OR Rexene Alberts, MD      . Derrill Memo ON 05/18/2018] dexmedetomidine (PRECEDEX) 400 MCG/100ML (4 mcg/mL) infusion  0.1-0.7 mcg/kg/hr Intravenous To OR Rexene Alberts, MD      . [  START ON 05/18/2018] DOPamine (INTROPIN) 800 mg in dextrose 5 % 250 mL (3.2 mg/mL) infusion  0-10 mcg/kg/min Intravenous To OR Rexene Alberts, MD      . Derrill Memo ON 05/18/2018] EPINEPHrine (ADRENALIN) 4 mg in dextrose 5 % 250 mL (0.016 mg/mL) infusion  0-10 mcg/min Intravenous To OR Rexene Alberts, MD      . Derrill Memo ON 05/18/2018] heparin 2,500 Units, papaverine 30 mg in electrolyte-148 (PLASMALYTE-148) 500 mL irrigation   Irrigation To OR Rexene Alberts, MD      . Derrill Memo ON 05/18/2018] heparin 30,000 units/NS 1000 mL solution for CELLSAVER   Other To OR Rexene Alberts, MD      . Derrill Memo ON 05/18/2018] insulin regular (NOVOLIN R,HUMULIN R) 100 Units in sodium chloride 0.9 % 100 mL (1 Units/mL) infusion   Intravenous To OR Rexene Alberts, MD      . Derrill Memo ON 05/18/2018] [facility] Cardioplegia (KBC) lidocaine 2% Syringe (51mL)  13 mL Intracoronary Once Rexene Alberts, MD      . Derrill Memo ON 05/18/2018]  [facility] Blood Cardioplegia (KBC) lidocaine 2% Syringe (26mL)  13 mL Intracoronary Once Rexene Alberts, MD      . Derrill Memo ON 05/18/2018] [facility]oplegia (KBC) mannitol 20% Syringe (51mL)  32 mL Intracoronary Once Rexene Alberts, MD      . Derrill Memo ON 05/18/2018] [facility] Blood Cardioplegia (KBC) mannitol 20% Syringe (19mL)  32 mL Intracoronary Once Rexene Alberts, MD      . Derrill Memo ON 05/18/2018] magnesium sulfate (IV Push/IM) injection 40 mEq  40 mEq Other To OR Rexene Alberts, MD      . Derrill Memo ON 05/18/2018] milrinone (PRIMACOR) 20 MG/100 ML (0.2 mg/mL) infusion  0.125 mcg/kg/min Intravenous To OR Rexene Alberts, MD      . Derrill Memo ON 05/18/2018] nitroGLYCERIN 50 mg in dextrose 5 % 250 mL (0.2 mg/mL) infusion  2-200 mcg/min Intravenous To OR Rexene Alberts, MD      . Derrill Memo ON 05/18/2018] phenylephrine (NEO-SYNEPHRINE) 20 mg in sodium chloride 0.9 % 250 mL (0.08 mg/mL) infusion  30-200 mcg/min Intravenous To OR Rexene Alberts, MD      . Derrill Memo ON 05/18/2018] potassium chloride injection 80 mEq  80 mEq Other To OR Rexene Alberts, MD      . Derrill Memo ON 05/18/2018] tranexamic acid (CYKLOKAPRON) 2,500 mg in sodium chloride 0.9 % 250 mL (10 mg/mL) infusion  1.5 mg/kg/hr Intravenous To OR Rexene Alberts, MD      . Derrill Memo ON 05/18/2018] tranexamic acid (CYKLOKAPRON) bolus via infusion - over 30 minutes 1,518 mg  15 mg/kg Intravenous To OR Rexene Alberts, MD      . Derrill Memo ON 05/18/2018] tranexamic acid (CYKLOKAPRON) pump prime solution 202 mg  2 mg/kg Intracatheter To OR Rexene Alberts, MD      . Derrill Memo ON 05/18/2018] vancomycin (VANCOCIN) 1,000 mg in sodium chloride 0.9 % 1,000 mL irrigation   Irrigation To OR Rexene Alberts, MD      . Derrill Memo ON 05/18/2018] vancomycin (VANCOCIN) 1,500 mg in sodium chloride 0.9 % 250 mL IVPB  1,500 mg Intravenous To OR Rexene Alberts, MD          Physical Exam:   BP 126/73 (BP Location: Left Arm, Patient Position: Sitting, Cuff Size: Normal)    Pulse 72   Resp 18   Ht 5\' 9"  (1.753 m)   Wt 222 lb 12.8 oz (101.1 kg)  SpO2 97% Comment: RA  BMI 32.90 kg/m   General:  Well-appearing  Chest:   Clear to auscultation  CV:   Regular rate and rhythm with prominent systolic murmur  Incisions:  n/a  Abdomen:  Soft nontender  Extremities:  Warm and well-perfused  Diagnostic Tests:  CHEST - 2 VIEW  COMPARISON:  06/26/2017  FINDINGS: Cardiac shadow is stable. The lungs are well aerated bilaterally. No focal infiltrate or sizable effusion is seen. Mild degenerative change of the thoracic spine is seen.  IMPRESSION: No acute abnormality noted.   Electronically Signed   By: Inez Catalina M.D.   On: 05/14/2018 12:37   Impression:  Patient has stage D severe symptomatic aortic stenosis.  He describes progressive symptoms of exertional shortness of breath and fatigue consistent with chronic diastolic congestive heart failure, New York Heart Association functional class II.  I personally reviewed the patient's recent transthoracic echocardiogram, diagnostic cardiac catheterization, and CT angiogram.  Echocardiogram reveals the presence of a trileaflet aortic valve with severe aortic stenosis.  There is normal left ventricular systolic function.  Diagnostic cardiac catheterization confirmed the presence of severe aortic stenosis and was notable for the absence of significant coronary artery disease.  Cardiac gated CT angiogram of the heart confirms anatomical characteristics consistent with aortic stenosis.  There is no significant aneurysmal enlargement of the aortic root or ascending thoracic aorta.  Risks associated with conventional surgery should be relatively low.  Patient appears to be an acceptable candidate for minimally invasive approach for surgery without any significant complicating features.   Plan:  The patient and his wife were again counseled at length regarding treatment alternatives for management of severe aortic  stenosis including continued medical therapy versus proceeding with aortic valve replacement.  The natural history of aortic stenosis was reviewed, as was long term prognosis with medical therapy alone.  Surgical options were discussed at length including conventional surgical aortic valve replacement through either a full median sternotomy or using minimally invasive techniques.  Other alternatives including rapid-deployment bioprosthetic tissue valve replacement, transcatheter aortic valve replacement, and patch enlargement of the aortic root were discussed.  Discussion was held comparing the relative risks of mechanical valve replacement with need for lifelong anticoagulation versus use of a bioprosthetic tissue valve and the associated potential for late structural valve deterioration and failure.  This discussion was placed in the context of the patient's particular circumstances, and as a result the patient specifically requests that their valve be replaced using a bioprosthetic tissue valve.    Expectations for his postoperative convalescence have been discussed.  The patient understands and accepts all potential associated risks of surgery including but not limited to risk of death, stroke, myocardial infarction, congestive heart failure, respiratory failure, renal failure, pneumonia, bleeding requiring blood transfusion and or reexploration, arrhythmia, heart block or bradycardia requiring permanent pacemaker, aortic dissection or other major vascular complication, pleural effusions or other delayed complications related to continued congestive heart failure, and other late complications related to valve replacement including structural valve deterioration and failure, thrombosis, endocarditis, or paravalvular leak.  Specific risks potentially related to the minimally-invasive approach were discussed at length, including but not limited to risk of conversion to full or partial sternotomy, aortic  dissection or other major vascular complication, unilateral acute lung injury or pulmonary edema, phrenic nerve dysfunction or paralysis, rib fracture, chronic pain, lung hernia, or lymphocele.    I spent in excess of 15 minutes during the conduct of this office consultation and >50% of this  time involved direct face-to-face encounter with the patient for counseling and/or coordination of their care.   Valentina Gu. Roxy Manns, MD 05/17/2018 2:35 PM
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
## 2018-05-18 ENCOUNTER — Inpatient Hospital Stay (HOSPITAL_COMMUNITY): Payer: Medicare Other

## 2018-05-18 ENCOUNTER — Inpatient Hospital Stay (HOSPITAL_COMMUNITY)
Admission: RE | Admit: 2018-05-18 | Discharge: 2018-05-26 | DRG: 220 | Disposition: A | Discharge status: Home / Self Care | Payer: Medicare Other | Source: Ambulatory Visit | Attending: Thoracic Surgery (Cardiothoracic Vascular Surgery) | Admitting: Thoracic Surgery (Cardiothoracic Vascular Surgery)

## 2018-05-18 ENCOUNTER — Inpatient Hospital Stay (HOSPITAL_COMMUNITY): Payer: Medicare Other | Admitting: Certified Registered Nurse Anesthetist

## 2018-05-18 ENCOUNTER — Encounter (HOSPITAL_COMMUNITY)
Admission: RE | Disposition: A | Discharge status: Home / Self Care | Payer: Self-pay | Source: Ambulatory Visit | Attending: Thoracic Surgery (Cardiothoracic Vascular Surgery)

## 2018-05-18 ENCOUNTER — Encounter (HOSPITAL_COMMUNITY): Payer: Self-pay | Admitting: Urology

## 2018-05-18 ENCOUNTER — Inpatient Hospital Stay (HOSPITAL_COMMUNITY): Payer: Medicare Other | Admitting: Vascular Surgery

## 2018-05-18 DIAGNOSIS — K219 Gastro-esophageal reflux disease without esophagitis: Secondary | ICD-10-CM | POA: Diagnosis present

## 2018-05-18 DIAGNOSIS — J939 Pneumothorax, unspecified: Secondary | ICD-10-CM | POA: Diagnosis not present

## 2018-05-18 DIAGNOSIS — E669 Obesity, unspecified: Secondary | ICD-10-CM | POA: Diagnosis present

## 2018-05-18 DIAGNOSIS — D62 Acute posthemorrhagic anemia: Secondary | ICD-10-CM | POA: Diagnosis not present

## 2018-05-18 DIAGNOSIS — I700 Atherosclerosis of aorta: Secondary | ICD-10-CM | POA: Diagnosis not present

## 2018-05-18 DIAGNOSIS — I5032 Chronic diastolic (congestive) heart failure: Secondary | ICD-10-CM | POA: Diagnosis present

## 2018-05-18 DIAGNOSIS — J90 Pleural effusion, not elsewhere classified: Secondary | ICD-10-CM | POA: Diagnosis not present

## 2018-05-18 DIAGNOSIS — Z6832 Body mass index (BMI) 32.0-32.9, adult: Secondary | ICD-10-CM

## 2018-05-18 DIAGNOSIS — I493 Ventricular premature depolarization: Secondary | ICD-10-CM | POA: Diagnosis not present

## 2018-05-18 DIAGNOSIS — Q231 Congenital insufficiency of aortic valve: Secondary | ICD-10-CM | POA: Diagnosis not present

## 2018-05-18 DIAGNOSIS — J9383 Other pneumothorax: Secondary | ICD-10-CM | POA: Diagnosis not present

## 2018-05-18 DIAGNOSIS — I959 Hypotension, unspecified: Secondary | ICD-10-CM | POA: Diagnosis not present

## 2018-05-18 DIAGNOSIS — Z9689 Presence of other specified functional implants: Secondary | ICD-10-CM

## 2018-05-18 DIAGNOSIS — I447 Left bundle-branch block, unspecified: Secondary | ICD-10-CM | POA: Diagnosis not present

## 2018-05-18 DIAGNOSIS — G4733 Obstructive sleep apnea (adult) (pediatric): Secondary | ICD-10-CM | POA: Diagnosis present

## 2018-05-18 DIAGNOSIS — D696 Thrombocytopenia, unspecified: Secondary | ICD-10-CM | POA: Diagnosis not present

## 2018-05-18 DIAGNOSIS — R079 Chest pain, unspecified: Secondary | ICD-10-CM | POA: Diagnosis not present

## 2018-05-18 DIAGNOSIS — Z9841 Cataract extraction status, right eye: Secondary | ICD-10-CM | POA: Diagnosis not present

## 2018-05-18 DIAGNOSIS — E785 Hyperlipidemia, unspecified: Secondary | ICD-10-CM | POA: Diagnosis not present

## 2018-05-18 DIAGNOSIS — I083 Combined rheumatic disorders of mitral, aortic and tricuspid valves: Secondary | ICD-10-CM | POA: Diagnosis not present

## 2018-05-18 DIAGNOSIS — I480 Paroxysmal atrial fibrillation: Secondary | ICD-10-CM | POA: Diagnosis not present

## 2018-05-18 DIAGNOSIS — J9811 Atelectasis: Secondary | ICD-10-CM

## 2018-05-18 DIAGNOSIS — Z87891 Personal history of nicotine dependence: Secondary | ICD-10-CM

## 2018-05-18 DIAGNOSIS — I350 Nonrheumatic aortic (valve) stenosis: Secondary | ICD-10-CM | POA: Diagnosis not present

## 2018-05-18 DIAGNOSIS — I471 Supraventricular tachycardia: Secondary | ICD-10-CM | POA: Diagnosis not present

## 2018-05-18 DIAGNOSIS — I10 Essential (primary) hypertension: Secondary | ICD-10-CM | POA: Diagnosis not present

## 2018-05-18 DIAGNOSIS — Z4682 Encounter for fitting and adjustment of non-vascular catheter: Secondary | ICD-10-CM | POA: Diagnosis not present

## 2018-05-18 DIAGNOSIS — Z9842 Cataract extraction status, left eye: Secondary | ICD-10-CM

## 2018-05-18 DIAGNOSIS — Z79899 Other long term (current) drug therapy: Secondary | ICD-10-CM

## 2018-05-18 DIAGNOSIS — I358 Other nonrheumatic aortic valve disorders: Secondary | ICD-10-CM | POA: Diagnosis not present

## 2018-05-18 DIAGNOSIS — J45909 Unspecified asthma, uncomplicated: Secondary | ICD-10-CM | POA: Diagnosis present

## 2018-05-18 DIAGNOSIS — I77810 Thoracic aortic ectasia: Secondary | ICD-10-CM | POA: Diagnosis not present

## 2018-05-18 DIAGNOSIS — Z09 Encounter for follow-up examination after completed treatment for conditions other than malignant neoplasm: Secondary | ICD-10-CM

## 2018-05-18 DIAGNOSIS — Z7982 Long term (current) use of aspirin: Secondary | ICD-10-CM | POA: Diagnosis not present

## 2018-05-18 DIAGNOSIS — I371 Nonrheumatic pulmonary valve insufficiency: Secondary | ICD-10-CM | POA: Diagnosis not present

## 2018-05-18 DIAGNOSIS — Z953 Presence of xenogenic heart valve: Secondary | ICD-10-CM

## 2018-05-18 HISTORY — PX: TEE WITHOUT CARDIOVERSION: SHX5443

## 2018-05-18 HISTORY — DX: Presence of xenogenic heart valve: Z95.3

## 2018-05-18 HISTORY — PX: AORTIC VALVE REPLACEMENT: SHX41

## 2018-05-18 LAB — POCT I-STAT 3, ART BLOOD GAS (G3+)
ACID-BASE DEFICIT: 2 mmol/L (ref 0.0–2.0)
ACID-BASE DEFICIT: 3 mmol/L — AB (ref 0.0–2.0)
Acid-base deficit: 1 mmol/L (ref 0.0–2.0)
Acid-base deficit: 1 mmol/L (ref 0.0–2.0)
Acid-base deficit: 4 mmol/L — ABNORMAL HIGH (ref 0.0–2.0)
Acid-base deficit: 3 mmol/L — ABNORMAL HIGH (ref 0.0–2.0)
BICARBONATE: 25 mmol/L (ref 20.0–28.0)
BICARBONATE: 22.4 mmol/L (ref 20.0–28.0)
BICARBONATE: 22.1 mmol/L (ref 20.0–28.0)
Bicarbonate: 24.7 mmol/L (ref 20.0–28.0)
Bicarbonate: 24.6 mmol/L (ref 20.0–28.0)
Bicarbonate: 21.5 mmol/L (ref 20.0–28.0)
O2 SAT: 90 %
O2 SAT: 98 %
O2 SAT: 98 %
O2 Saturation: 100 %
O2 Saturation: 100 %
O2 Saturation: 96 %
PCO2 ART: 52.1 mmHg — AB (ref 32.0–48.0)
PH ART: 7.333 — AB (ref 7.350–7.450)
PH ART: 7.365 (ref 7.350–7.450)
PH ART: 7.357 (ref 7.350–7.450)
PO2 ART: 113 mmHg — AB (ref 83.0–108.0)
Patient temperature: 35.8
Patient temperature: 36.7
Patient temperature: 37.5
TCO2: 26 mmol/L (ref 22–32)
TCO2: 26 mmol/L (ref 22–32)
TCO2: 26 mmol/L (ref 22–32)
TCO2: 23 mmol/L (ref 22–32)
TCO2: 24 mmol/L (ref 22–32)
TCO2: 23 mmol/L (ref 22–32)
pCO2 arterial: 46.4 mmHg (ref 32.0–48.0)
pCO2 arterial: 43.6 mmHg (ref 32.0–48.0)
pCO2 arterial: 36.6 mmHg (ref 32.0–48.0)
pCO2 arterial: 39.7 mmHg (ref 32.0–48.0)
pCO2 arterial: 39.5 mmHg (ref 32.0–48.0)
pH, Arterial: 7.284 — ABNORMAL LOW (ref 7.350–7.450)
pH, Arterial: 7.372 (ref 7.350–7.450)
pH, Arterial: 7.359 (ref 7.350–7.450)
pO2, Arterial: 68 mmHg — ABNORMAL LOW (ref 83.0–108.0)
pO2, Arterial: 365 mmHg — ABNORMAL HIGH (ref 83.0–108.0)
pO2, Arterial: 199 mmHg — ABNORMAL HIGH (ref 83.0–108.0)
pO2, Arterial: 77 mmHg — ABNORMAL LOW (ref 83.0–108.0)
pO2, Arterial: 108 mmHg (ref 83.0–108.0)

## 2018-05-18 LAB — POCT I-STAT, CHEM 8
BUN: 16 mg/dL (ref 8–23)
BUN: 15 mg/dL (ref 8–23)
BUN: 14 mg/dL (ref 8–23)
BUN: 15 mg/dL (ref 8–23)
BUN: 17 mg/dL (ref 8–23)
BUN: 16 mg/dL (ref 8–23)
BUN: 15 mg/dL (ref 8–23)
CALCIUM ION: 1.22 mmol/L (ref 1.15–1.40)
CALCIUM ION: 1.19 mmol/L (ref 1.15–1.40)
CALCIUM ION: 1.06 mmol/L — AB (ref 1.15–1.40)
CALCIUM ION: 1.17 mmol/L (ref 1.15–1.40)
CHLORIDE: 102 mmol/L (ref 98–111)
CHLORIDE: 103 mmol/L (ref 98–111)
CHLORIDE: 102 mmol/L (ref 98–111)
CHLORIDE: 103 mmol/L (ref 98–111)
CREATININE: 0.8 mg/dL (ref 0.61–1.24)
CREATININE: 0.8 mg/dL (ref 0.61–1.24)
CREATININE: 0.8 mg/dL (ref 0.61–1.24)
CREATININE: 0.8 mg/dL (ref 0.61–1.24)
CREATININE: 0.8 mg/dL (ref 0.61–1.24)
Calcium, Ion: 1.08 mmol/L — ABNORMAL LOW (ref 1.15–1.40)
Calcium, Ion: 1.09 mmol/L — ABNORMAL LOW (ref 1.15–1.40)
Calcium, Ion: 1.1 mmol/L — ABNORMAL LOW (ref 1.15–1.40)
Chloride: 101 mmol/L (ref 98–111)
Chloride: 102 mmol/L (ref 98–111)
Chloride: 105 mmol/L (ref 98–111)
Creatinine, Ser: 0.7 mg/dL (ref 0.61–1.24)
Creatinine, Ser: 0.7 mg/dL (ref 0.61–1.24)
GLUCOSE: 117 mg/dL — AB (ref 70–99)
GLUCOSE: 185 mg/dL — AB (ref 70–99)
GLUCOSE: 163 mg/dL — AB (ref 70–99)
GLUCOSE: 139 mg/dL — AB (ref 70–99)
Glucose, Bld: 107 mg/dL — ABNORMAL HIGH (ref 70–99)
Glucose, Bld: 132 mg/dL — ABNORMAL HIGH (ref 70–99)
Glucose, Bld: 175 mg/dL — ABNORMAL HIGH (ref 70–99)
HCT: 37 % — ABNORMAL LOW (ref 39.0–52.0)
HCT: 35 % — ABNORMAL LOW (ref 39.0–52.0)
HCT: 32 % — ABNORMAL LOW (ref 39.0–52.0)
HCT: 28 % — ABNORMAL LOW (ref 39.0–52.0)
HCT: 33 % — ABNORMAL LOW (ref 39.0–52.0)
HCT: 28 % — ABNORMAL LOW (ref 39.0–52.0)
HEMATOCRIT: 26 % — AB (ref 39.0–52.0)
HEMOGLOBIN: 10.9 g/dL — AB (ref 13.0–17.0)
HEMOGLOBIN: 8.8 g/dL — AB (ref 13.0–17.0)
HEMOGLOBIN: 11.2 g/dL — AB (ref 13.0–17.0)
Hemoglobin: 12.6 g/dL — ABNORMAL LOW (ref 13.0–17.0)
Hemoglobin: 11.9 g/dL — ABNORMAL LOW (ref 13.0–17.0)
Hemoglobin: 9.5 g/dL — ABNORMAL LOW (ref 13.0–17.0)
Hemoglobin: 9.5 g/dL — ABNORMAL LOW (ref 13.0–17.0)
POTASSIUM: 4.3 mmol/L (ref 3.5–5.1)
POTASSIUM: 5.7 mmol/L — AB (ref 3.5–5.1)
POTASSIUM: 5.4 mmol/L — AB (ref 3.5–5.1)
Potassium: 4 mmol/L (ref 3.5–5.1)
Potassium: 4.5 mmol/L (ref 3.5–5.1)
Potassium: 5.1 mmol/L (ref 3.5–5.1)
Potassium: 4.1 mmol/L (ref 3.5–5.1)
SODIUM: 139 mmol/L (ref 135–145)
SODIUM: 135 mmol/L (ref 135–145)
SODIUM: 137 mmol/L (ref 135–145)
Sodium: 141 mmol/L (ref 135–145)
Sodium: 139 mmol/L (ref 135–145)
Sodium: 138 mmol/L (ref 135–145)
Sodium: 138 mmol/L (ref 135–145)
TCO2: 28 mmol/L (ref 22–32)
TCO2: 24 mmol/L (ref 22–32)
TCO2: 24 mmol/L (ref 22–32)
TCO2: 27 mmol/L (ref 22–32)
TCO2: 26 mmol/L (ref 22–32)
TCO2: 23 mmol/L (ref 22–32)
TCO2: 22 mmol/L (ref 22–32)

## 2018-05-18 LAB — CBC
HCT: 34.7 % — ABNORMAL LOW (ref 39.0–52.0)
HEMATOCRIT: 29 % — AB (ref 39.0–52.0)
HEMOGLOBIN: 9.7 g/dL — AB (ref 13.0–17.0)
Hemoglobin: 11.8 g/dL — ABNORMAL LOW (ref 13.0–17.0)
MCH: 30.3 pg (ref 26.0–34.0)
MCH: 30 pg (ref 26.0–34.0)
MCHC: 34 g/dL (ref 30.0–36.0)
MCHC: 33.4 g/dL (ref 30.0–36.0)
MCV: 89 fL (ref 78.0–100.0)
MCV: 89.8 fL (ref 78.0–100.0)
PLATELETS: 129 10*3/uL — AB (ref 150–400)
Platelets: 144 10*3/uL — ABNORMAL LOW (ref 150–400)
RBC: 3.9 MIL/uL — AB (ref 4.22–5.81)
RBC: 3.23 MIL/uL — AB (ref 4.22–5.81)
RDW: 13.5 % (ref 11.5–15.5)
RDW: 13.5 % (ref 11.5–15.5)
WBC: 13.3 10*3/uL — ABNORMAL HIGH (ref 4.0–10.5)
WBC: 8 10*3/uL (ref 4.0–10.5)

## 2018-05-18 LAB — GLUCOSE, CAPILLARY
GLUCOSE-CAPILLARY: 115 mg/dL — AB (ref 70–99)
GLUCOSE-CAPILLARY: 110 mg/dL — AB (ref 70–99)
GLUCOSE-CAPILLARY: 107 mg/dL — AB (ref 70–99)
Glucose-Capillary: 107 mg/dL — ABNORMAL HIGH (ref 70–99)
Glucose-Capillary: 105 mg/dL — ABNORMAL HIGH (ref 70–99)

## 2018-05-18 LAB — APTT: aPTT: 36 seconds (ref 24–36)

## 2018-05-18 LAB — POCT I-STAT 4, (NA,K, GLUC, HGB,HCT)
Glucose, Bld: 121 mg/dL — ABNORMAL HIGH (ref 70–99)
HEMATOCRIT: 31 % — AB (ref 39.0–52.0)
HEMOGLOBIN: 10.5 g/dL — AB (ref 13.0–17.0)
POTASSIUM: 4.4 mmol/L (ref 3.5–5.1)
Sodium: 139 mmol/L (ref 135–145)

## 2018-05-18 LAB — PREPARE RBC (CROSSMATCH)

## 2018-05-18 LAB — CREATININE, SERUM
CREATININE: 0.94 mg/dL (ref 0.61–1.24)
GFR calc Af Amer: >60 mL/min (ref >60)

## 2018-05-18 LAB — HEMOGLOBIN AND HEMATOCRIT, BLOOD
HEMATOCRIT: 28.5 % — AB (ref 39.0–52.0)
Hemoglobin: 9.7 g/dL — ABNORMAL LOW (ref 13.0–17.0)

## 2018-05-18 LAB — PROTIME-INR
INR: 1.47
Prothrombin Time: 17.7 s — ABNORMAL HIGH (ref 11.4–15.2)

## 2018-05-18 LAB — MAGNESIUM: Magnesium: 2.6 mg/dL — ABNORMAL HIGH (ref 1.7–2.4)

## 2018-05-18 LAB — PLATELET COUNT: PLATELETS: 138 10*3/uL — AB (ref 150–400)

## 2018-05-18 SURGERY — REPLACEMENT, AORTIC VALVE, MINIMALLY INVASIVE
Anesthesia: General | Site: Chest

## 2018-05-18 MED ORDER — ROCURONIUM BROMIDE 50 MG/5ML IV SOLN
INTRAVENOUS | Status: AC
  Filled 2018-05-18: qty 2

## 2018-05-18 MED ORDER — METOPROLOL TARTRATE 12.5 MG HALF TABLET
ORAL_TABLET | ORAL | Status: AC
  Administered 2018-05-18: 12.5 mg via ORAL
  Filled 2018-05-18: qty 1

## 2018-05-18 MED ORDER — METOPROLOL TARTRATE 5 MG/5ML IV SOLN
2.5000 mg | INTRAVENOUS | Status: DC | PRN
  Administered 2018-05-25: 5 mg via INTRAVENOUS
  Filled 2018-05-18: qty 5

## 2018-05-18 MED ORDER — PROTAMINE SULFATE 10 MG/ML IV SOLN
INTRAVENOUS | Status: DC | PRN
  Administered 2018-05-18: 360 mg via INTRAVENOUS

## 2018-05-18 MED ORDER — LACTATED RINGERS IV SOLN
INTRAVENOUS | Status: DC
  Administered 2018-05-19: 01:00:00 via INTRAVENOUS

## 2018-05-18 MED ORDER — PROPOFOL 10 MG/ML IV BOLUS
INTRAVENOUS | Status: AC
  Filled 2018-05-18: qty 20

## 2018-05-18 MED ORDER — LACTATED RINGERS IV SOLN
INTRAVENOUS | Status: DC | PRN
  Administered 2018-05-18: 08:00:00 via INTRAVENOUS

## 2018-05-18 MED ORDER — MORPHINE SULFATE (PF) 2 MG/ML IV SOLN
1.0000 mg | INTRAVENOUS | Status: DC | PRN
  Administered 2018-05-18 – 2018-05-20 (×6): 2 mg via INTRAVENOUS
  Filled 2018-05-18 (×6): qty 1

## 2018-05-18 MED ORDER — SUCCINYLCHOLINE CHLORIDE 20 MG/ML IJ SOLN
INTRAMUSCULAR | Status: AC
  Filled 2018-05-18: qty 1

## 2018-05-18 MED ORDER — FAMOTIDINE IN NACL 20-0.9 MG/50ML-% IV SOLN
20.0000 mg | Freq: Two times a day (BID) | INTRAVENOUS | Status: AC
  Administered 2018-05-18 (×2): 20 mg via INTRAVENOUS
  Filled 2018-05-18: qty 50

## 2018-05-18 MED ORDER — LACTATED RINGERS IV SOLN
INTRAVENOUS | Status: DC | PRN
  Administered 2018-05-18: 07:00:00 via INTRAVENOUS

## 2018-05-18 MED ORDER — CHLORHEXIDINE GLUCONATE 0.12% ORAL RINSE (MEDLINE KIT)
15.0000 mL | Freq: Two times a day (BID) | OROMUCOSAL | Status: DC
  Administered 2018-05-18: 15 mL via OROMUCOSAL

## 2018-05-18 MED ORDER — DOCUSATE SODIUM 100 MG PO CAPS
200.0000 mg | ORAL_CAPSULE | Freq: Every day | ORAL | Status: DC
  Administered 2018-05-19 – 2018-05-26 (×7): 200 mg via ORAL
  Filled 2018-05-18 (×8): qty 2

## 2018-05-18 MED ORDER — MAGNESIUM SULFATE 4 GM/100ML IV SOLN
4.0000 g | Freq: Once | INTRAVENOUS | Status: AC
  Administered 2018-05-18: 4 g via INTRAVENOUS
  Filled 2018-05-18: qty 100

## 2018-05-18 MED ORDER — HEPARIN SODIUM (PORCINE) 1000 UNIT/ML IJ SOLN
INTRAMUSCULAR | Status: AC
  Filled 2018-05-18: qty 1

## 2018-05-18 MED ORDER — BISACODYL 5 MG PO TBEC
10.0000 mg | DELAYED_RELEASE_TABLET | Freq: Every day | ORAL | Status: DC
  Administered 2018-05-19 – 2018-05-25 (×6): 10 mg via ORAL
  Filled 2018-05-18 (×8): qty 2

## 2018-05-18 MED ORDER — CHLORHEXIDINE GLUCONATE 0.12 % MT SOLN
15.0000 mL | Freq: Once | OROMUCOSAL | Status: AC
  Administered 2018-05-18: 15 mL via OROMUCOSAL

## 2018-05-18 MED ORDER — CHLORHEXIDINE GLUCONATE 0.12 % MT SOLN
15.0000 mL | OROMUCOSAL | Status: AC
  Administered 2018-05-18: 15 mL via OROMUCOSAL

## 2018-05-18 MED ORDER — ALBUMIN HUMAN 5 % IV SOLN
250.0000 mL | INTRAVENOUS | Status: AC | PRN
  Administered 2018-05-18 (×2): 250 mL via INTRAVENOUS

## 2018-05-18 MED ORDER — LACTATED RINGERS IV SOLN: INTRAVENOUS | Status: DC

## 2018-05-18 MED ORDER — VANCOMYCIN HCL IN DEXTROSE 1-5 GM/200ML-% IV SOLN
1000.0000 mg | Freq: Once | INTRAVENOUS | Status: AC
  Administered 2018-05-18: 1000 mg via INTRAVENOUS
  Filled 2018-05-18: qty 200

## 2018-05-18 MED ORDER — 0.9 % SODIUM CHLORIDE (POUR BTL) OPTIME
TOPICAL | Status: DC | PRN
  Administered 2018-05-18: 5000 mL

## 2018-05-18 MED ORDER — FENTANYL CITRATE (PF) 250 MCG/5ML IJ SOLN
INTRAMUSCULAR | Status: AC
  Filled 2018-05-18: qty 25

## 2018-05-18 MED ORDER — PROTAMINE SULFATE 10 MG/ML IV SOLN
INTRAVENOUS | Status: AC
  Filled 2018-05-18: qty 10

## 2018-05-18 MED ORDER — ORAL CARE MOUTH RINSE
15.0000 mL | OROMUCOSAL | Status: DC
  Administered 2018-05-18 – 2018-05-19 (×2): 15 mL via OROMUCOSAL

## 2018-05-18 MED ORDER — MORPHINE SULFATE (PF) 2 MG/ML IV SOLN
1.0000 mg | INTRAVENOUS | Status: DC | PRN
  Administered 2018-05-18 (×2): 2 mg via INTRAVENOUS
  Filled 2018-05-18 (×2): qty 1
  Filled 2018-05-18: qty 2

## 2018-05-18 MED ORDER — ONDANSETRON HCL 4 MG/2ML IJ SOLN: 4.0000 mg | Freq: Four times a day (QID) | INTRAMUSCULAR | Status: DC | PRN

## 2018-05-18 MED ORDER — CHLORHEXIDINE GLUCONATE 0.12 % MT SOLN
OROMUCOSAL | Status: AC
  Administered 2018-05-18: 15 mL via OROMUCOSAL
  Filled 2018-05-18: qty 15

## 2018-05-18 MED ORDER — ACETAMINOPHEN 650 MG RE SUPP
650.0000 mg | Freq: Once | RECTAL | Status: AC
  Administered 2018-05-18: 650 mg via RECTAL

## 2018-05-18 MED ORDER — POTASSIUM CHLORIDE 10 MEQ/50ML IV SOLN: 10.0000 meq | INTRAVENOUS | Status: AC

## 2018-05-18 MED ORDER — SODIUM CHLORIDE 0.9 % IV SOLN
INTRAVENOUS | Status: DC
  Filled 2018-05-18: qty 1

## 2018-05-18 MED ORDER — MIDAZOLAM HCL 10 MG/2ML IJ SOLN
INTRAMUSCULAR | Status: AC
  Filled 2018-05-18: qty 2

## 2018-05-18 MED ORDER — CHLORHEXIDINE GLUCONATE 4 % EX LIQD: 30.0000 mL | CUTANEOUS | Status: DC

## 2018-05-18 MED ORDER — HEPARIN SODIUM (PORCINE) 1000 UNIT/ML IJ SOLN
INTRAMUSCULAR | Status: DC | PRN
  Administered 2018-05-18: 36000 [IU] via INTRAVENOUS

## 2018-05-18 MED ORDER — EPHEDRINE SULFATE 50 MG/ML IJ SOLN
INTRAMUSCULAR | Status: AC
  Filled 2018-05-18: qty 1

## 2018-05-18 MED ORDER — MIDAZOLAM HCL 2 MG/2ML IJ SOLN
2.0000 mg | INTRAMUSCULAR | Status: DC | PRN
  Administered 2018-05-18 (×2): 2 mg via INTRAVENOUS
  Filled 2018-05-18 (×2): qty 2

## 2018-05-18 MED ORDER — LACTATED RINGERS IV SOLN: 500.0000 mL | Freq: Once | INTRAVENOUS | Status: DC | PRN

## 2018-05-18 MED ORDER — ASPIRIN EC 325 MG PO TBEC
325.0000 mg | DELAYED_RELEASE_TABLET | Freq: Every day | ORAL | Status: DC
  Administered 2018-05-19 – 2018-05-26 (×8): 325 mg via ORAL
  Filled 2018-05-18 (×8): qty 1

## 2018-05-18 MED ORDER — PANTOPRAZOLE SODIUM 40 MG PO TBEC
40.0000 mg | DELAYED_RELEASE_TABLET | Freq: Every day | ORAL | Status: DC
  Administered 2018-05-20 – 2018-05-26 (×7): 40 mg via ORAL
  Filled 2018-05-18 (×7): qty 1

## 2018-05-18 MED ORDER — METOPROLOL TARTRATE 12.5 MG HALF TABLET: 12.5000 mg | ORAL_TABLET | Freq: Two times a day (BID) | ORAL | Status: DC

## 2018-05-18 MED ORDER — ACETAMINOPHEN 160 MG/5ML PO SOLN: 650.0000 mg | Freq: Once | ORAL | Status: AC

## 2018-05-18 MED ORDER — INSULIN REGULAR BOLUS VIA INFUSION
0.0000 [IU] | Freq: Three times a day (TID) | INTRAVENOUS | Status: DC
  Filled 2018-05-18: qty 10

## 2018-05-18 MED ORDER — SODIUM CHLORIDE 0.45 % IV SOLN
INTRAVENOUS | Status: DC | PRN
  Administered 2018-05-18: 14:00:00 via INTRAVENOUS

## 2018-05-18 MED ORDER — SODIUM CHLORIDE 0.9% FLUSH: 3.0000 mL | INTRAVENOUS | Status: DC | PRN

## 2018-05-18 MED ORDER — SODIUM CHLORIDE 0.9 % IV SOLN
0.0000 ug/min | INTRAVENOUS | Status: DC
  Filled 2018-05-18: qty 2

## 2018-05-18 MED ORDER — SODIUM CHLORIDE 0.9 % IV SOLN
250.0000 mL | INTRAVENOUS | Status: DC
  Administered 2018-05-20: 250 mL via INTRAVENOUS

## 2018-05-18 MED ORDER — FENTANYL CITRATE (PF) 100 MCG/2ML IJ SOLN
INTRAMUSCULAR | Status: DC | PRN
  Administered 2018-05-18: 100 ug via INTRAVENOUS
  Administered 2018-05-18: 50 ug via INTRAVENOUS
  Administered 2018-05-18 (×4): 100 ug via INTRAVENOUS
  Administered 2018-05-18: 300 ug via INTRAVENOUS
  Administered 2018-05-18: 100 ug via INTRAVENOUS

## 2018-05-18 MED ORDER — FINASTERIDE 5 MG PO TABS
5.0000 mg | ORAL_TABLET | Freq: Every evening | ORAL | Status: DC
  Administered 2018-05-19 – 2018-05-25 (×7): 5 mg via ORAL
  Filled 2018-05-18 (×7): qty 1

## 2018-05-18 MED ORDER — TRAMADOL HCL 50 MG PO TABS
50.0000 mg | ORAL_TABLET | ORAL | Status: DC | PRN
  Administered 2018-05-19 – 2018-05-20 (×3): 100 mg via ORAL
  Administered 2018-05-21 – 2018-05-22 (×2): 50 mg via ORAL
  Administered 2018-05-23 – 2018-05-26 (×4): 100 mg via ORAL
  Filled 2018-05-18 (×2): qty 2
  Filled 2018-05-18: qty 1
  Filled 2018-05-18: qty 2
  Filled 2018-05-18: qty 1
  Filled 2018-05-18 (×4): qty 2

## 2018-05-18 MED ORDER — ASPIRIN 81 MG PO CHEW: 324.0000 mg | CHEWABLE_TABLET | Freq: Every day | ORAL | Status: DC

## 2018-05-18 MED ORDER — PHENYLEPHRINE HCL 10 MG/ML IJ SOLN
INTRAVENOUS | Status: DC | PRN
  Administered 2018-05-18: 10 ug/min via INTRAVENOUS

## 2018-05-18 MED ORDER — SODIUM CHLORIDE 0.9% IV SOLUTION
Freq: Once | INTRAVENOUS | Status: AC
  Administered 2018-05-18: 16:00:00 via INTRAVENOUS

## 2018-05-18 MED ORDER — ARTIFICIAL TEARS OPHTHALMIC OINT
TOPICAL_OINTMENT | OPHTHALMIC | Status: DC | PRN
  Administered 2018-05-18: 1 via OPHTHALMIC

## 2018-05-18 MED ORDER — ROCURONIUM BROMIDE 100 MG/10ML IV SOLN
INTRAVENOUS | Status: DC | PRN
  Administered 2018-05-18: 100 mg via INTRAVENOUS
  Administered 2018-05-18: 20 mg via INTRAVENOUS
  Administered 2018-05-18: 50 mg via INTRAVENOUS
  Administered 2018-05-18: 10 mg via INTRAVENOUS

## 2018-05-18 MED ORDER — ACETAMINOPHEN 500 MG PO TABS
1000.0000 mg | ORAL_TABLET | Freq: Four times a day (QID) | ORAL | Status: AC
  Administered 2018-05-18 – 2018-05-23 (×17): 1000 mg via ORAL
  Filled 2018-05-18 (×17): qty 2

## 2018-05-18 MED ORDER — ATORVASTATIN CALCIUM 40 MG PO TABS
40.0000 mg | ORAL_TABLET | Freq: Every evening | ORAL | Status: DC
  Administered 2018-05-19 – 2018-05-25 (×7): 40 mg via ORAL
  Filled 2018-05-18 (×7): qty 1

## 2018-05-18 MED ORDER — METOPROLOL TARTRATE 12.5 MG HALF TABLET
12.5000 mg | ORAL_TABLET | Freq: Once | ORAL | Status: AC
  Administered 2018-05-18: 12.5 mg via ORAL

## 2018-05-18 MED ORDER — HEPARIN SODIUM (PORCINE) 1000 UNIT/ML IJ SOLN
INTRAMUSCULAR | Status: AC
  Filled 2018-05-18: qty 3

## 2018-05-18 MED ORDER — METOPROLOL TARTRATE 25 MG/10 ML ORAL SUSPENSION: 12.5000 mg | Freq: Two times a day (BID) | ORAL | Status: DC

## 2018-05-18 MED ORDER — DEXMEDETOMIDINE HCL IN NACL 400 MCG/100ML IV SOLN
0.0000 ug/kg/h | INTRAVENOUS | Status: DC
  Administered 2018-05-18: 0.7 ug/kg/h via INTRAVENOUS

## 2018-05-18 MED ORDER — MIDAZOLAM HCL 5 MG/5ML IJ SOLN
INTRAMUSCULAR | Status: DC | PRN
  Administered 2018-05-18 (×3): 2 mg via INTRAVENOUS

## 2018-05-18 MED ORDER — ACETAMINOPHEN 160 MG/5ML PO SOLN: 1000.0000 mg | Freq: Four times a day (QID) | ORAL | Status: DC

## 2018-05-18 MED ORDER — SODIUM CHLORIDE 0.9 % IV SOLN
INTRAVENOUS | Status: DC | PRN
  Administered 2018-05-18: 13:00:00 via INTRAVENOUS

## 2018-05-18 MED ORDER — PROTAMINE SULFATE 10 MG/ML IV SOLN
INTRAVENOUS | Status: AC
  Filled 2018-05-18: qty 25

## 2018-05-18 MED ORDER — DEXMEDETOMIDINE HCL IN NACL 200 MCG/50ML IV SOLN
0.0000 ug/kg/h | INTRAVENOUS | Status: DC
  Administered 2018-05-18: 0.7 ug/kg/h via INTRAVENOUS
  Filled 2018-05-18: qty 50

## 2018-05-18 MED ORDER — OXYCODONE HCL 5 MG PO TABS
5.0000 mg | ORAL_TABLET | ORAL | Status: DC | PRN
  Administered 2018-05-18 – 2018-05-19 (×4): 10 mg via ORAL
  Administered 2018-05-20 (×2): 5 mg via ORAL
  Administered 2018-05-21: 10 mg via ORAL
  Administered 2018-05-21 – 2018-05-22 (×3): 5 mg via ORAL
  Administered 2018-05-22 – 2018-05-23 (×6): 10 mg via ORAL
  Administered 2018-05-24: 5 mg via ORAL
  Administered 2018-05-24 (×2): 10 mg via ORAL
  Administered 2018-05-25 (×3): 5 mg via ORAL
  Filled 2018-05-18 (×2): qty 1
  Filled 2018-05-18 (×5): qty 2
  Filled 2018-05-18: qty 1
  Filled 2018-05-18 (×2): qty 2
  Filled 2018-05-18: qty 1
  Filled 2018-05-18 (×3): qty 2
  Filled 2018-05-18: qty 1
  Filled 2018-05-18: qty 2
  Filled 2018-05-18 (×2): qty 1
  Filled 2018-05-18: qty 2
  Filled 2018-05-18: qty 1
  Filled 2018-05-18 (×3): qty 2

## 2018-05-18 MED ORDER — LIDOCAINE 2% (20 MG/ML) 5 ML SYRINGE
INTRAMUSCULAR | Status: AC
  Filled 2018-05-18: qty 5

## 2018-05-18 MED ORDER — NITROGLYCERIN IN D5W 200-5 MCG/ML-% IV SOLN
0.0000 ug/min | INTRAVENOUS | Status: DC
  Administered 2018-05-18: 10 ug/min via INTRAVENOUS

## 2018-05-18 MED ORDER — PHENYLEPHRINE 40 MCG/ML (10ML) SYRINGE FOR IV PUSH (FOR BLOOD PRESSURE SUPPORT)
PREFILLED_SYRINGE | INTRAVENOUS | Status: AC
  Filled 2018-05-18: qty 10

## 2018-05-18 MED ORDER — SODIUM CHLORIDE 0.9% FLUSH
3.0000 mL | Freq: Two times a day (BID) | INTRAVENOUS | Status: DC
  Administered 2018-05-19 – 2018-05-25 (×11): 3 mL via INTRAVENOUS

## 2018-05-18 MED ORDER — SODIUM CHLORIDE 0.9 % IV SOLN
INTRAVENOUS | Status: DC
  Administered 2018-05-18: 14:00:00 via INTRAVENOUS

## 2018-05-18 MED ORDER — PHENYLEPHRINE HCL 10 MG/ML IJ SOLN
INTRAMUSCULAR | Status: DC | PRN
  Administered 2018-05-18: 15 ug/min via INTRAVENOUS

## 2018-05-18 MED ORDER — SODIUM CHLORIDE 0.9 % IV SOLN
INTRAVENOUS | Status: AC
  Administered 2018-05-18: 15:00:00 via INTRAVENOUS

## 2018-05-18 MED ORDER — BISACODYL 10 MG RE SUPP
10.0000 mg | Freq: Every day | RECTAL | Status: DC
  Administered 2018-05-22: 10 mg via RECTAL

## 2018-05-18 MED ORDER — SODIUM CHLORIDE 0.9 % IV SOLN
1.5000 g | Freq: Two times a day (BID) | INTRAVENOUS | Status: AC
  Administered 2018-05-18 – 2018-05-20 (×4): 1.5 g via INTRAVENOUS
  Filled 2018-05-18 (×4): qty 1.5

## 2018-05-18 MED ORDER — PROPOFOL 10 MG/ML IV BOLUS
INTRAVENOUS | Status: DC | PRN
  Administered 2018-05-18: 80 mg via INTRAVENOUS

## 2018-05-18 MED ORDER — PHENYLEPHRINE HCL 10 MG/ML IJ SOLN
INTRAMUSCULAR | Status: DC | PRN
  Administered 2018-05-18: 80 ug via INTRAVENOUS

## 2018-05-18 SURGICAL SUPPLY — 95 items
ADAPTER CARDIO PERF ANTE/RETRO (ADAPTER) ×3 IMPLANT
BAG DECANTER FOR FLEXI CONT (MISCELLANEOUS) ×3 IMPLANT
BATTERY MAXDRIVER (MISCELLANEOUS) ×6 IMPLANT
BLADE SURG ROTATE 9660 (MISCELLANEOUS) ×3 IMPLANT
CANISTER SUCT 3000ML PPV (MISCELLANEOUS) ×3 IMPLANT
CANNULA FEM VENOUS REMOTE 22FR (CANNULA) ×3 IMPLANT
CANNULA GUNDRY RCSP 15FR (MISCELLANEOUS) ×3 IMPLANT
CANNULA OPTISITE PERFUSION 16F (CANNULA) IMPLANT
CANNULA OPTISITE PERFUSION 18F (CANNULA) ×3 IMPLANT
CANNULA SUMP PERICARDIAL (CANNULA) ×3 IMPLANT
CATH ENDOVENT PULMONARY (CATHETERS) IMPLANT
CATH HEART VENT LEFT (CATHETERS) ×2 IMPLANT
CELLS DAT CNTRL 66122 CELL SVR (MISCELLANEOUS) ×2 IMPLANT
CONN ST 1/4X3/8  BEN (MISCELLANEOUS) ×4
CONN ST 1/4X3/8 BEN (MISCELLANEOUS) ×8 IMPLANT
CONNECTOR 1/2X3/8X1/2 3 WAY (MISCELLANEOUS) ×1
CONNECTOR 1/2X3/8X1/2 3WAY (MISCELLANEOUS) ×2 IMPLANT
CONT SPEC 4OZ CLIKSEAL STRL BL (MISCELLANEOUS) ×6 IMPLANT
COVER BACK TABLE 24X17X13 BIG (DRAPES) ×3 IMPLANT
COVER PROBE W GEL 5X96 (DRAPES) ×3 IMPLANT
CRADLE DONUT ADULT HEAD (MISCELLANEOUS) ×3 IMPLANT
DERMABOND ADVANCED (GAUZE/BANDAGES/DRESSINGS) ×2
DERMABOND ADVANCED .7 DNX12 (GAUZE/BANDAGES/DRESSINGS) ×4 IMPLANT
DEVICE PMI PUNCTURE CLOSURE (MISCELLANEOUS) ×3 IMPLANT
DEVICE SUT CK QUICK LOAD MINI (Prosthesis & Implant Heart) ×3 IMPLANT
DEVICE TROCAR PUNCTURE CLOSURE (ENDOMECHANICALS) ×3 IMPLANT
DRAIN CHANNEL 32F RND 10.7 FF (WOUND CARE) ×6 IMPLANT
DRAPE BILATERAL SPLIT (DRAPES) ×3 IMPLANT
DRAPE CV SPLIT W-CLR ANES SCRN (DRAPES) ×3 IMPLANT
DRAPE INCISE IOBAN 66X45 STRL (DRAPES) ×9 IMPLANT
DRAPE SLUSH/WARMER DISC (DRAPES) ×3 IMPLANT
DRSG AQUACEL AG ADV 3.5X10 (GAUZE/BANDAGES/DRESSINGS) ×3 IMPLANT
ELECT BLADE 4.0 EZ CLEAN MEGAD (MISCELLANEOUS) ×3
ELECT BLADE 6.5 EXT (BLADE) ×6 IMPLANT
ELECT REM PT RETURN 9FT ADLT (ELECTROSURGICAL) ×6
ELECTRODE BLDE 4.0 EZ CLN MEGD (MISCELLANEOUS) ×2 IMPLANT
ELECTRODE REM PT RTRN 9FT ADLT (ELECTROSURGICAL) ×4 IMPLANT
FELT TEFLON 1X6 (MISCELLANEOUS) ×6 IMPLANT
FEMORAL VENOUS CANN RAP (CANNULA) IMPLANT
GAUZE SPONGE 4X4 12PLY STRL (GAUZE/BANDAGES/DRESSINGS) ×3 IMPLANT
GLOVE BIO SURGEON STRL SZ 6 (GLOVE) ×9 IMPLANT
GLOVE BIO SURGEON STRL SZ 6.5 (GLOVE) ×18 IMPLANT
GLOVE BIOGEL PI IND STRL 8 (GLOVE) ×4 IMPLANT
GLOVE BIOGEL PI INDICATOR 8 (GLOVE) ×2
GLOVE ORTHO TXT STRL SZ7.5 (GLOVE) ×9 IMPLANT
GOWN STRL REUS W/ TWL LRG LVL3 (GOWN DISPOSABLE) ×8 IMPLANT
GOWN STRL REUS W/TWL LRG LVL3 (GOWN DISPOSABLE) ×4
KIT BASIN OR (CUSTOM PROCEDURE TRAY) ×3 IMPLANT
KIT CATH SUCT 8FR (CATHETERS) ×3 IMPLANT
KIT DILATOR VASC 18G NDL (KITS) ×3 IMPLANT
KIT DRAINAGE VACCUM ASSIST (KITS) ×3 IMPLANT
KIT SUCTION CATH 14FR (SUCTIONS) ×6 IMPLANT
KIT SUT CK MINI COMBO 4X17 (Prosthesis & Implant Heart) ×3 IMPLANT
KIT TURNOVER KIT B (KITS) ×3 IMPLANT
LEAD PACING MYOCARDI (MISCELLANEOUS) ×12 IMPLANT
LINE VENT (MISCELLANEOUS) ×3 IMPLANT
NEEDLE AORTIC ROOT 14G 7F (CATHETERS) ×3 IMPLANT
NS IRRIG 1000ML POUR BTL (IV SOLUTION) ×15 IMPLANT
PACK OPEN HEART (CUSTOM PROCEDURE TRAY) ×3 IMPLANT
PAD ARMBOARD 7.5X6 YLW CONV (MISCELLANEOUS) ×6 IMPLANT
PAD ELECT DEFIB RADIOL ZOLL (MISCELLANEOUS) ×3 IMPLANT
PLATE RIB X SHAPE 10HOLE (Plate) ×3 IMPLANT
RTRCTR WOUND ALEXIS 18CM MED (MISCELLANEOUS) ×3
SCREW STERNAL 2.3X17MM (Screw) ×9 IMPLANT
SCREW STERNAL LOCK 2.3MM (Screw) ×15 IMPLANT
SET CANNULATION TOURNIQUET (MISCELLANEOUS) ×3 IMPLANT
SET CARDIOPLEGIA MPS 5001102 (MISCELLANEOUS) ×3 IMPLANT
SET IRRIG TUBING LAPAROSCOPIC (IRRIGATION / IRRIGATOR) ×3 IMPLANT
SOLUTION ANTI FOG 6CC (MISCELLANEOUS) ×3 IMPLANT
SUT BONE WAX W31G (SUTURE) ×3 IMPLANT
SUT ETHIBON 2 0 V 52N 30 (SUTURE) ×3 IMPLANT
SUT ETHIBOND (SUTURE) ×3 IMPLANT
SUT ETHIBOND 2-0 RB-1 WHT (SUTURE) ×3 IMPLANT
SUT ETHIBOND X763 2 0 SH 1 (SUTURE) ×15 IMPLANT
SUT GORETEX CV 4 TH 22 36 (SUTURE) ×6 IMPLANT
SUT GORETEX CV4 TH-18 (SUTURE) ×9 IMPLANT
SUT PROLENE 3 0 SH1 36 (SUTURE) ×6 IMPLANT
SUT PROLENE 4 0 RB 1 (SUTURE) ×6
SUT PROLENE 4-0 RB1 .5 CRCL 36 (SUTURE) ×12 IMPLANT
SUT PROLENE 6 0 C 1 30 (SUTURE) ×6 IMPLANT
SUT TEM PAC WIRE 2 0 SH (SUTURE) ×6 IMPLANT
SUT VIC AB 3-0 SH 8-18 (SUTURE) ×3 IMPLANT
SYSTEM SAHARA CHEST DRAIN ATS (WOUND CARE) ×3 IMPLANT
TAPE CLOTH SURG 4X10 WHT LF (GAUZE/BANDAGES/DRESSINGS) ×3 IMPLANT
TAPE PAPER 2X10 WHT MICROPORE (GAUZE/BANDAGES/DRESSINGS) ×3 IMPLANT
TOWEL GREEN STERILE (TOWEL DISPOSABLE) ×3 IMPLANT
TOWEL GREEN STERILE FF (TOWEL DISPOSABLE) ×3 IMPLANT
TRAY FOLEY SLVR 16FR TEMP STAT (SET/KITS/TRAYS/PACK) ×3 IMPLANT
TROCAR XCEL BLADELESS 5X75MML (TROCAR) ×3 IMPLANT
TROCAR XCEL NON-BLD 11X100MML (ENDOMECHANICALS) ×6 IMPLANT
TUBE SUCT INTRACARD DLP 20F (MISCELLANEOUS) ×3 IMPLANT
UNDERPAD 30X30 (UNDERPADS AND DIAPERS) ×3 IMPLANT
VALVE SYSTEM EDWS INTUITY 23A (Prosthesis & Implant Heart) ×3 IMPLANT
VENT LEFT HEART 12002 (CATHETERS) ×3
WATER STERILE IRR 1000ML POUR (IV SOLUTION) ×6 IMPLANT

## 2018-05-18 NOTE — Op Note (Signed)
CARDIOTHORACIC SURGERY OPERATIVE NOTE  Date of Procedure:  05/18/2018  Preoperative Diagnosis: Severe Aortic Stenosis   Postoperative Diagnosis: Same   Procedure:    Minimally Invasive Aortic Valve Replacement Right Anterior Mini-thoracotomy Edwards Intuity Elite Stented Bovine Pericardial Tissue Valve (size 23 mm, model #8300AB, serial #[id number]) Plating of right 2nd costal cartilage (KLS titanium plate)   Surgeon: Valentina Gu. Roxy Manns, MD  Assistant: Nani Skillern, PA-C  Anesthesia: Laurie Panda, MD  Operative Findings:  [place] type I bicuspid aortic valve  Severe aortic stenosis  Normal LV systolic function  Moderate LV hypertrophy           BRIEF CLINICAL NOTE AND INDICATIONS FOR SURGERY  Patient has a 72 year old moderately obese male with history of aortic stenosis, hyperlipidemia, and obstructive sleep apnea who has been referred for surgical consultation to discuss treatment options for management of severe symptomatic aortic stenosis.  Patient states that he has known of the presence of a heart murmur for several years. He has been followed by Dr. Wynonia Lawman and more recently by Dr. Burt Knack for aortic stenosis. Previous echocardiograms revealed findings suggestive of moderate aortic stenosis and the patient has remained asymptomatic. Over the past several months patient has developed noticeable progression of symptoms of exertional shortness of breath, chest tightness, and fatigue. Recent follow-up echocardiogram performed at Dr. Thurman Coyer office on Apr 05, 2018 revealed significant progression of disease with severe aortic stenosis. Peak velocity across the aortic valve was reported 5.1 m/s corresponding to mean transvalvular gradients estimated 63 mmHg. Left ventricular systolic function remain normal with ejection fraction calculated 60%. The patient was seen in follow-up by Dr. Burt Knack and underwent diagnostic cardiac catheterization on Apr 20, 2018.  Catheterization confirmed the presence of severe aortic stenosis with mean transvalvular gradient measured 68 mmHg corresponding to aortic valve area calculated 0.74 cm. There was widely patent coronary arteries with no significant coronary artery disease. Right heart pressures were normal. CT angiography was performed and cardiothoracic surgical consultation was requested.  The patient has been seen in consultation and counseled at length regarding the indications, risks and potential benefits of surgery.  All questions have been answered, and the patient provides full informed consent for the operation as described.    DETAILS OF THE OPERATIVE PROCEDURE  Preparation:  The patient is brought to the operating room on the above mentioned date and central monitoring was established by the anesthesia team including placement of Swan-Ganz catheter through the left internal jugular vein.  A radial arterial line is placed. The patient is placed in the supine position on the operating table.  Intravenous antibiotics are administered. General endotracheal anesthesia is induced uneventfully. The patient is initially intubated using a dual lumen endotracheal tube.  A Foley catheter is placed.  Baseline transesophageal echocardiogram was performed.  Findings were notable for severe aortic stenosis with normal left ventricular systolic function.  There was moderate left ventricular hypertrophy.  There was mild aortic insufficiency.  No other abnormalities were noted.  The patient is placed in the supine position with their neck gently extended and turned to the left.   The patient's right neck, chest, abdomen, both groins, and both lower extremities are prepared and draped in a sterile manner. A time out procedure is performed.   Surgical Approach:  A right miniature anteriorthoracotomy incision is performed. The incision is placed immediately over the 3rd intercostal space.  The muscle fibers of the  pectoralis major muscle are split longitudinally and the right pleural space is entered through  the 3rd intercostal space. The 2nd costal cartilage is divided at it's insertion onto the sternum.  The right internal mammary artery and veins are ligated and divided.  A soft tissue retractor is placed.  Two 11 mm ports are placed through separate stab incisions inferiorly. The right pleural space is insufflated continuously with carbon dioxide gas through the posterior port during the remainder of the operation.     Extracorporeal Cardiopulmonary Bypass and Myocardial Protection:  A small incision is made in the right inguinal crease and the anterior surface of the right common femoral artery and right common femoral vein are identified.  The patient is placed in Trendelenburg position. The right internal jugular vein is cannulated with Seldinger technique and a guidewire advanced into the right atrium. The patient is heparinized systemically.  A 14 French pediatric femoral venous cannula is placed through the right internal jugular vein into the superior vena cava.  Pursestring sutures are placed on the anterior surface of the right common femoral vein and right common femoral artery. The right common femoral vein is cannulated with the Seldinger technique and a guidewire is advanced under transesophageal echocardiogram guidance through the right atrium. The femoral vein is cannulated with a long 22 French femoral venous cannula. The right common femoral artery is cannulated with Seldinger technique and a flexible guidewire is advanced until it can be appreciated intraluminally in the descending thoracic aorta on transesophageal echocardiogram. The femoral artery is cannulated with an 18 French femoral arterial cannula.  Adequate heparinization is verified.      The entire pre-bypass portion of the operation was notable for stable hemodynamics.  Cardiopulmonary bypass was begun.  Vacuum assist venous drainage  is utilized. An incision is made in the pericardium over the ascending aorta and extended in both directions. Silk traction sutures are placed to retract the pericardium.  Venous drainage and exposure are notably excellent.  A left ventricular vent is placed through the right superior pulmonary vein.  An antegrade cardioplegia cannula is placed in the ascending aorta.    The patient is cooled to 28C systemic temperature.  The aortic cross clamp is applied and cardioplegia is delivered initially in an antegrade fashion through the aortic root using modified [place] cold blood cardioplegia ([place] blood cardioplegia protocol).   The initial cardioplegic arrest is rapid with early diastolic arrest.   Myocardial protection was felt to be excellent.   Aortic Valve Replacement:  An oblique transverse aortotomy incision was performed.  The aortic valve was inspected and notable for Sievers type I bicuspid aortic valve with a single raphe between the left and right leaflets.  There is severe thickening and calcification of all leaflets and severe aortic stenosis.  The aortic valve leaflets were excised sharply and the aortic annulus decalcified.  Decalcification was notably tedious.  The aortic annulus was sized to accept a 23 mm prosthesis.  The aortic root and left ventricle were irrigated with copious cold saline solution.  Aortic valve replacement was performed using an [employer] rapid deployment pericardial tissue valve (size 23 mm, model #8300AB, serial # [id number]).  The valve was rinsed in saline for manufacture recommendations. A total of 5 individual guiding sutures are placed through the aortic annulus, 2 each straddling the corresponding nadirs of right and non-coronary sinus of Valsalva with a single suture at the nadir of the left.  The valve was attached to the delivery device and the 3 guiding sutures placed through the valve sewing cuff. The valve  is lowered into place. Care is  taken to insure that the valve is completely seated in the annulus. Each of the guide sutures are secured using Cor knot clips.  The valve stent is deployed by inflating the deployment balloon to 4.5 atm pressure and holding for 10 seconds. The balloon is deflated the valve holder sutures cut In the deployment system removed.   The valve is carefully inspected to make sure that it is seated appropriately. Endoscopic visualization through the valve is utilized to inspect the left ventricular outflow tract. Aortic root was filled with saline to make sure the valve is competent. Rewarming is begun.   Procedure Completion:  The aortotomy was closed using a 2-layer closure of running 4-0 Prolene suture.  One final dose of warm retrograde "hot shot" cardioplegia was administered retrograde through the coronary sinus catheter while all air was evacuated through the aortic root.  The aortic cross clamp was removed after a total cross clamp time of 71 minutes.  Epicardial pacing wires are fixed to the right ventricular outflow tract and to the right atrial appendage. The patient is rewarmed to 37C temperature.  The pericardial sac was drained using a 28 French Bard drain placed through the anterior port incision.   The patient is weaned and disconnected from cardiopulmonary bypass.  The patient's rhythm at separation from bypass was AV paced.  The patient was weaned from bypass without any inotropic support. Total cardiopulmonary bypass time for the operation was 123 minutes.  Followup transesophageal echocardiogram performed after separation from bypass revealed a well-seated bioprosthetic tissue valve in the aortic position with no perivalvular leak.   Deep transgastric views are obtained from multiple angles to make certain the valve is well-seated.  Mean transvalvular gradient across the valve is estimated 7 mmHg.  Left ventricular function was unchanged from preoperatively.  The femoral arterial and venous  cannulae were removed uneventfully. There was a palpable pulse in the distal right common femoral artery after removal of the cannula.   Protamine was administered to reverse the anticoagulation.   The right internal jugular venous cannula is removed and manual pressure held for 15 minutes.  Single lung ventilation was begun. The aortotomy closure was inspected for hemostasis. The right pleural space is irrigated with saline solution and inspected for hemostasis. The right pleural space was drained using a 28 French Bard drain placed through the posterior port incision. The 2nd costal cartilage is replaced using a small KLS titanium plate.  The miniature thoracotomy incision was closed in multiple layers in routine fashion. The right groin incision was inspected for hemostasis and closed in multiple layers in routine fashion.   The post-bypass portion of the operation was notable for stable rhythm and hemodynamics.   No blood products were administered during the operation.   Disposition:  The patient tolerated the procedure well.  The patient was reintubated using a single lumen endotracheal tube and subsequently transported to the surgical intensive care unit in stable condition. There were no intraoperative complications. All sponge instrument and needle counts are verified correct at completion of the operation.     Valentina Gu. Roxy Manns MD 05/18/2018 1:33 PM

## 2018-05-18 NOTE — Progress Notes (Signed)
Dr. Roxy Manns notified of patient with 150cc output from chest tubes since 1500.  Order for 2 units of FFP and 2 units of Platelets.

## 2018-05-18 NOTE — Progress Notes (Signed)
Dr. Roxy Manns made aware of Chest drainage of 350cc at this time.  Order to continue to monitor and let MD know if continue to bleed.

## 2018-05-18 NOTE — Progress Notes (Signed)
Rapid wean Protocol initiated at this time.

## 2018-05-18 NOTE — Anesthesia Procedure Notes (Signed)
Procedure Name: Intubation Date/Time: 05/18/2018 8:40 AM Performed by: Julieta Bellini, CRNA Pre-anesthesia Checklist: Patient identified, Emergency Drugs available, Suction available and Patient being monitored Patient Re-evaluated:Patient Re-evaluated prior to induction Oxygen Delivery Method: Circle system utilized Preoxygenation: Pre-oxygenation with 100% oxygen Induction Type: IV induction Ventilation: Mask ventilation without difficulty and Oral airway inserted - appropriate to patient size Laryngoscope Size: Mac and 4 Grade View: Grade I Tube type: Oral Endobronchial tube: Left and Double lumen EBT and 39 Fr Number of attempts: 1 Airway Equipment and Method: Stylet and Fiberoptic brochoscope Placement Confirmation: ETT inserted through vocal cords under direct vision,  positive ETCO2 and breath sounds checked- equal and bilateral Secured at: 29 cm Tube secured with: Tape Dental Injury: Teeth and Oropharynx as per pre-operative assessment

## 2018-05-18 NOTE — Brief Op Note (Signed)
05/18/2018  1:28 PM  PATIENT:  Devon Novak  72 y.o. male  PRE-OPERATIVE DIAGNOSIS:  SEVERE AS  POST-OPERATIVE DIAGNOSIS:  1. SEVERE AS 2. BICUSPID AORTIC VALVE  PROCEDURE:  TRANSESOPHAGEAL ECHOCARDIOGRAM (TEE), MINIMALLY INVASIVE AORTIC VALVE REPLACEMENT (AVR) (using an Edwards Intuity Aortic Valve, Model # 0413SC, Serial # [id number], size 23)  SURGEON:  Surgeon(s) and Role:    Rexene Alberts, MD - Primary  PHYSICIAN ASSISTANT: Lars Pinks PA-C  ASSISTANTS: Ara Kussmaul RNFA  ANESTHESIA:   general  EBL:  500   DRAINS: Bard drains placed in the right pleural space and pericardial sac area   SPECIMEN:  Source of Specimen:  Native aortic valve leaflets  DISPOSITION OF SPECIMEN:  PATHOLOGY  COUNTS CORRECT:  YES  DICTATION: .Dragon Dictation  PLAN OF CARE: Admit to inpatient   PATIENT DISPOSITION:  ICU - intubated and hemodynamically stable.   Delay start of Pharmacological VTE agent (>24hrs) due to surgical blood loss or risk of bleeding: yes  BASELINE WEIGHT: 100.7 kg

## 2018-05-18 NOTE — Anesthesia Preprocedure Evaluation (Signed)
Anesthesia Evaluation  Patient identified by MRN, date of birth, ID band Patient awake    Reviewed: Allergy & Precautions, NPO status , Patient's Chart, lab work & pertinent test results  History of Anesthesia Complications Negative for: history of anesthetic complications  Airway Mallampati: II  TM Distance: >3 FB Neck ROM: Full    Dental  (+) Teeth Intact,    Pulmonary asthma , sleep apnea , former smoker,    breath sounds clear to auscultation       Cardiovascular (-) hypertension(-) angina(-) CAD, (-) Past MI and (-) CHF + Valvular Problems/Murmurs AS  Rhythm:Regular     Neuro/Psych negative neurological ROS  negative psych ROS   GI/Hepatic hiatal hernia, GERD  Medicated and Controlled,(+) Hepatitis -  Endo/Other  negative endocrine ROS  Renal/GU negative Renal ROS     Musculoskeletal   Abdominal   Peds  Hematology negative hematology ROS (+)   Anesthesia Other Findings   Reproductive/Obstetrics                             Anesthesia Physical Anesthesia Plan  ASA: IV  Anesthesia Plan: General   Post-op Pain Management:    Induction: Intravenous  PONV Risk Score and Plan: 2 and Treatment may vary due to age or medical condition  Airway Management Planned: Double Lumen EBT  Additional Equipment: Arterial line, CVP, TEE, PA Cath and Ultrasound Guidance Line Placement  Intra-op Plan:   Post-operative Plan: Post-operative intubation/ventilation  Informed Consent: I have reviewed the patients History and Physical, chart, labs and discussed the procedure including the risks, benefits and alternatives for the proposed anesthesia with the patient or authorized representative who has indicated his/her understanding and acceptance.   Dental advisory given  Plan Discussed with: CRNA and Surgeon  Anesthesia Plan Comments:         Anesthesia Quick Evaluation

## 2018-05-18 NOTE — Anesthesia Procedure Notes (Signed)
Arterial Line Insertion Start/End6/25/2019 6:57 AM, 05/18/2018 6:57 AM Performed by: Julieta Bellini, CRNA, CRNA  Patient location: Pre-op. Preanesthetic checklist: patient identified, IV checked, site marked, risks and benefits discussed, surgical consent, monitors and equipment checked, pre-op evaluation, timeout performed and anesthesia consent Lidocaine 1% used for infiltration Left, radial was placed Catheter size: 20 G Hand hygiene performed  and maximum sterile barriers used   Attempts: 1 Procedure performed without using ultrasound guided technique. Following insertion, dressing applied and Biopatch. Post procedure assessment: normal  Patient tolerated the procedure well with no immediate complications.

## 2018-05-18 NOTE — Transfer of Care (Addendum)
Immediate Anesthesia Transfer of Care Note  Patient: Devon Novak  Procedure(s) Performed: MINIMALLY INVASIVE AORTIC VALVE REPLACEMENT (AVR) (N/A Chest) TRANSESOPHAGEAL ECHOCARDIOGRAM (TEE) (N/A )  Patient Location: SICU  Anesthesia Type:General  Level of Consciousness: sedated and Patient remains intubated per anesthesia plan  Airway & Oxygen Therapy: Patient remains intubated per anesthesia plan and Patient placed on Ventilator (see vital sign flow sheet for setting)  Post-op Assessment: Report given to RN and Post -op Vital signs reviewed and stable  Post vital signs: Reviewed and stable  Last Vitals:  Vitals Value Taken Time  BP    Temp    Pulse    Resp 15 05/18/2018  2:08 PM  SpO2 95 % 05/18/2018  2:08 PM  Vitals shown include unvalidated device data.  Last Pain:  Vitals:   05/18/18 0554  TempSrc:   PainSc: 0-No pain      Patients Stated Pain Goal: 0 (22/29/79 8921)  Complications: No apparent anesthesia complications    Patient transported to ICU with standard monitors (HR, BP, SPO2, RR) and emergency drugs/equipment. Controlled ventilation maintained via ambu bag. Report given to bedside RN and respiratory therapist. Pt connected to ICU monitor and ventilator. All questions answered and vital signs stable before leaving

## 2018-05-18 NOTE — Procedures (Signed)
Extubation Procedure Note  Patient Details:   Name: Devon Novak DOB: [birth year] MRN: [id number]   Airway Documentation:    Vent end date: 05/18/18 Vent end time: 2123   Evaluation  O2 sats: stable throughout Complications: No apparent complications Patient did tolerate procedure well. Bilateral Breath Sounds: Clear   Yes pt able to vocalize name and cough to clear secretions. Pt positive for cuff leak before extubation with a NIF of 40 and VC of 1600. Placed pt on 4L nasal cannula and tolerating well at this time.   Virgilio Frees 05/18/2018, 9:28 PM

## 2018-05-18 NOTE — Interval H&P Note (Signed)
History and Physical Interval Note:  05/18/2018 5:55 AM  Devon Novak  has presented today for surgery, with the diagnosis of AS  The various methods of treatment have been discussed with the patient and family. After consideration of risks, benefits and other options for treatment, the patient has consented to  Procedure(s): MINIMALLY INVASIVE AORTIC VALVE REPLACEMENT (AVR) (N/A) TRANSESOPHAGEAL ECHOCARDIOGRAM (TEE) (N/A) as a surgical intervention .  The patient's history has been reviewed, patient examined, no change in status, stable for surgery.  I have reviewed the patient's chart and labs.  Questions were answered to the patient's satisfaction.     Rexene Alberts

## 2018-05-18 NOTE — Progress Notes (Signed)
[facility] BRIEF SICU PROGRESS NOTE  Day of Surgery  S/P Procedure(s) (LRB): MINIMALLY INVASIVE AORTIC VALVE REPLACEMENT (AVR) (N/A) TRANSESOPHAGEAL ECHOCARDIOGRAM (TEE) (N/A)   Waking up on vent NSR w/ stable hemodynamics on NTG for hypertension O2 sats 100% Chest tube output trending down, now < 100 mL/hr UOP adequate  Plan: Continue routine early postop  Devon Alberts, MD 05/18/2018 7:32 PM

## 2018-05-18 NOTE — Anesthesia Procedure Notes (Signed)
Central Venous Catheter Insertion Performed by: Oleta Mouse, MD, anesthesiologist Start/End6/25/2019 7:18 AM, 05/18/2018 7:28 AM Patient location: Pre-op. Preanesthetic checklist: patient identified, IV checked, site marked, risks and benefits discussed, surgical consent, monitors and equipment checked, pre-op evaluation, timeout performed and anesthesia consent Position: Trendelenburg Lidocaine 1% used for infiltration and patient sedated Hand hygiene performed  and maximum sterile barriers used  Catheter size: 8.5 Fr Total catheter length 10. Sheath introducer Procedure performed using ultrasound guided technique. Ultrasound Notes:anatomy identified, needle tip was noted to be adjacent to the nerve/plexus identified, no ultrasound evidence of intravascular and/or intraneural injection and image(s) printed for medical record Attempts: 1 Following insertion, line sutured, dressing applied and Biopatch. Post procedure assessment: blood return through all ports, free fluid flow and no air  Patient tolerated the procedure well with no immediate complications.

## 2018-05-18 NOTE — Anesthesia Procedure Notes (Signed)
Central Venous Catheter Insertion Performed by: Oleta Mouse, MD, anesthesiologist Start/End6/25/2019 7:18 AM, 05/18/2018 7:28 AM Patient location: Pre-op. Preanesthetic checklist: patient identified, IV checked, site marked, risks and benefits discussed, surgical consent, monitors and equipment checked, pre-op evaluation, timeout performed and anesthesia consent Hand hygiene performed  and maximum sterile barriers used  PA cath was placed.Swan type:thermodilution Procedure performed without using ultrasound guided technique. Attempts: 1 Patient tolerated the procedure well with no immediate complications.

## 2018-05-19 ENCOUNTER — Other Ambulatory Visit: Payer: Self-pay

## 2018-05-19 ENCOUNTER — Encounter (HOSPITAL_COMMUNITY): Payer: Self-pay | Admitting: Thoracic Surgery (Cardiothoracic Vascular Surgery)

## 2018-05-19 ENCOUNTER — Inpatient Hospital Stay (HOSPITAL_COMMUNITY): Payer: Medicare Other

## 2018-05-19 LAB — CBC
HEMATOCRIT: 28.3 % — AB (ref 39.0–52.0)
HEMATOCRIT: 30.2 % — AB (ref 39.0–52.0)
HEMOGLOBIN: 10 g/dL — AB (ref 13.0–17.0)
Hemoglobin: 9.6 g/dL — ABNORMAL LOW (ref 13.0–17.0)
MCH: 30.8 pg (ref 26.0–34.0)
MCH: 30.4 pg (ref 26.0–34.0)
MCHC: 33.9 g/dL (ref 30.0–36.0)
MCHC: 33.1 g/dL (ref 30.0–36.0)
MCV: 90.7 fL (ref 78.0–100.0)
MCV: 91.8 fL (ref 78.0–100.0)
PLATELETS: 152 10*3/uL (ref 150–400)
Platelets: 150 10*3/uL (ref 150–400)
RBC: 3.12 MIL/uL — ABNORMAL LOW (ref 4.22–5.81)
RBC: 3.29 MIL/uL — AB (ref 4.22–5.81)
RDW: 13.6 % (ref 11.5–15.5)
RDW: 13.9 % (ref 11.5–15.5)
WBC: 7 10*3/uL (ref 4.0–10.5)
WBC: 14.8 10*3/uL — ABNORMAL HIGH (ref 4.0–10.5)

## 2018-05-19 LAB — BASIC METABOLIC PANEL
Anion gap: 5 (ref 5–15)
BUN: 14 mg/dL (ref 8–23)
CALCIUM: 7.3 mg/dL — AB (ref 8.9–10.3)
CO2: 24 mmol/L (ref 22–32)
Chloride: 109 mmol/L (ref 98–111)
Creatinine, Ser: 0.92 mg/dL (ref 0.61–1.24)
GLUCOSE: 108 mg/dL — AB (ref 70–99)
Potassium: 4.1 mmol/L (ref 3.5–5.1)
Sodium: 138 mmol/L (ref 135–145)

## 2018-05-19 LAB — PREPARE FRESH FROZEN PLASMA

## 2018-05-19 LAB — GLUCOSE, CAPILLARY
GLUCOSE-CAPILLARY: 105 mg/dL — AB (ref 70–99)
GLUCOSE-CAPILLARY: 139 mg/dL — AB (ref 70–99)
GLUCOSE-CAPILLARY: 111 mg/dL — AB (ref 70–99)
Glucose-Capillary: 106 mg/dL — ABNORMAL HIGH (ref 70–99)
Glucose-Capillary: 102 mg/dL — ABNORMAL HIGH (ref 70–99)

## 2018-05-19 LAB — PREPARE PLATELET PHERESIS
UNIT DIVISION: 0
Unit division: 0

## 2018-05-19 LAB — POCT I-STAT, CHEM 8
BUN: 18 mg/dL (ref 8–23)
CHLORIDE: 100 mmol/L (ref 98–111)
Calcium, Ion: 1.12 mmol/L — ABNORMAL LOW (ref 1.15–1.40)
Creatinine, Ser: 1.3 mg/dL — ABNORMAL HIGH (ref 0.61–1.24)
Glucose, Bld: 134 mg/dL — ABNORMAL HIGH (ref 70–99)
HEMATOCRIT: 28 % — AB (ref 39.0–52.0)
Hemoglobin: 9.5 g/dL — ABNORMAL LOW (ref 13.0–17.0)
Potassium: 4.2 mmol/L (ref 3.5–5.1)
SODIUM: 137 mmol/L (ref 135–145)
TCO2: 25 mmol/L (ref 22–32)

## 2018-05-19 LAB — BPAM FFP
Blood Product Expiration Date: 201906292359
Blood Product Expiration Date: 201906292359
ISSUE DATE / TIME: 201906251601
ISSUE DATE / TIME: 201906251601
Unit Type and Rh: 6200
Unit Type and Rh: 6200

## 2018-05-19 LAB — BPAM PLATELET PHERESIS
Blood Product Expiration Date: 201906272359
Blood Product Expiration Date: 201906282359
ISSUE DATE / TIME: 201906251600
ISSUE DATE / TIME: 201906251723
UNIT TYPE AND RH: 6200
Unit Type and Rh: 5100

## 2018-05-19 LAB — MAGNESIUM
MAGNESIUM: 2.5 mg/dL — AB (ref 1.7–2.4)
Magnesium: 2.5 mg/dL — ABNORMAL HIGH (ref 1.7–2.4)

## 2018-05-19 LAB — CREATININE, SERUM
Creatinine, Ser: 1.34 mg/dL — ABNORMAL HIGH (ref 0.61–1.24)
GFR calc Af Amer: 60 mL/min — ABNORMAL LOW (ref >60)
GFR, EST NON AFRICAN AMERICAN: 52 mL/min — AB (ref >60)

## 2018-05-19 MED ORDER — ORAL CARE MOUTH RINSE: 15.0000 mL | Freq: Four times a day (QID) | OROMUCOSAL | Status: DC

## 2018-05-19 MED ORDER — FUROSEMIDE 10 MG/ML IJ SOLN
20.0000 mg | Freq: Four times a day (QID) | INTRAMUSCULAR | Status: AC
Start: 2018-05-19 — End: 2018-05-19
  Administered 2018-05-19 (×3): 20 mg via INTRAVENOUS
  Filled 2018-05-19 (×3): qty 2

## 2018-05-19 MED ORDER — INSULIN ASPART 100 UNIT/ML ~~LOC~~ SOLN
0.0000 [IU] | SUBCUTANEOUS | Status: DC
  Administered 2018-05-19 – 2018-05-20 (×2): 2 [IU] via SUBCUTANEOUS

## 2018-05-19 MED ORDER — ORAL CARE MOUTH RINSE
15.0000 mL | Freq: Two times a day (BID) | OROMUCOSAL | Status: DC
  Administered 2018-05-20 – 2018-05-25 (×10): 15 mL via OROMUCOSAL

## 2018-05-19 MED ORDER — KETOROLAC TROMETHAMINE 15 MG/ML IJ SOLN
15.0000 mg | Freq: Four times a day (QID) | INTRAMUSCULAR | Status: DC
  Administered 2018-05-19 (×2): 15 mg via INTRAVENOUS
  Filled 2018-05-19 (×2): qty 1

## 2018-05-19 MED FILL — Mannitol IV Soln 20%: INTRAVENOUS | Qty: 500 | Status: AC

## 2018-05-19 MED FILL — Sodium Bicarbonate IV Soln 8.4%: INTRAVENOUS | Qty: 50 | Status: AC

## 2018-05-19 MED FILL — Sodium Chloride IV Soln 0.9%: INTRAVENOUS | Qty: 2000 | Status: AC

## 2018-05-19 MED FILL — Electrolyte-R (PH 7.4) Solution: INTRAVENOUS | Qty: 3000 | Status: AC

## 2018-05-19 MED FILL — Magnesium Sulfate Inj 50%: INTRAMUSCULAR | Qty: 10 | Status: AC

## 2018-05-19 MED FILL — Heparin Sodium (Porcine) Inj 1000 Unit/ML: INTRAMUSCULAR | Qty: 2500 | Status: AC

## 2018-05-19 MED FILL — Potassium Chloride Inj 2 mEq/ML: INTRAVENOUS | Qty: 40 | Status: AC

## 2018-05-19 MED FILL — Heparin Sodium (Porcine) Inj 1000 Unit/ML: INTRAMUSCULAR | Qty: 30 | Status: AC

## 2018-05-19 MED FILL — Heparin Sodium (Porcine) Inj 1000 Unit/ML: INTRAMUSCULAR | Qty: 10 | Status: AC

## 2018-05-19 NOTE — Progress Notes (Signed)
      [address]       [place] [address]             [phone]        CARDIOTHORACIC SURGERY PROGRESS NOTE   R1 Day Post-Op Procedure(s) (LRB): MINIMALLY INVASIVE AORTIC VALVE REPLACEMENT (AVR) (N/A) TRANSESOPHAGEAL ECHOCARDIOGRAM (TEE) (N/A)  Subjective: Looks good.  Expected soreness in chest.  No SOB.  No nausea  Objective: Vital signs: BP Readings from Last 1 Encounters:  05/19/18 104/60   Pulse Readings from Last 1 Encounters:  05/18/18 66   Resp Readings from Last 1 Encounters:  05/19/18 12   Temp Readings from Last 1 Encounters:  05/19/18 (!) 101.1 F (38.4 C)    Hemodynamics: PAP: (14-42)/(5-24) 42/18 CO:  [4.5 L/min-7 L/min] 7 L/min CI:  [2.1 L/min/m2-3.3 L/min/m2] 3.3 L/min/m2  Physical Exam:  Rhythm:   sinus  Breath sounds: clear  Heart sounds:  RRR  Incisions:  Dressings dry, intact  Abdomen:  Soft, non-distended, non-tender  Extremities:  Warm, well-perfused  Chest tubes:  low volume thin serosanguinous output, no air leak    Intake/Output from previous day: 06/25 0701 - 06/26 0700 In: 5519.1 [I.V.:4011; CVELF:8101; IV Piggyback:319.1] Out: 2665 [Urine:1105; Blood:500; Chest Tube:1060] Intake/Output this shift: Total I/O In: 180 [P.O.:120; I.V.:40.1; IV Piggyback:20] Out: 170 [Urine:60; Chest Tube:110]  Lab Results:  CBC: Recent Labs    05/18/18 2116 05/18/18 2121 05/19/18 0330  WBC 8.0  --  7.0  HGB 9.7* 9.5* 9.6*  HCT 29.0* 28.0* 28.3*  PLT 144*  --  152    BMET:  Recent Labs    05/18/18 2121 05/19/18 0330  NA 138 138  K 4.1 4.1  CL 105 109  CO2  --  24  GLUCOSE 139* 108*  BUN 15 14  CREATININE 0.80 0.92  CALCIUM  --  7.3*     PT/INR:   Recent Labs    05/18/18 1415  LABPROT 17.7*  INR 1.47    CBG (last 3)  Recent Labs    05/18/18 1932 05/19/18 0335 05/19/18 0722  GLUCAP 107* 105* 106*    ABG    Component Value Date/Time   PHART 7.357 05/18/2018 2248   PCO2ART 39.5 05/18/2018  2248   PO2ART 113.0 (H) 05/18/2018 2248   HCO3 22.1 05/18/2018 2248   TCO2 23 05/18/2018 2248   ACIDBASEDEF 3.0 (H) 05/18/2018 2248   O2SAT 98.0 05/18/2018 2248    CXR: Mild RLL atelectasis, otherwise clear  EKG: NSR w/out acute ischemic changes, new LBBB   Assessment/Plan: S/P Procedure(s) (LRB): MINIMALLY INVASIVE AORTIC VALVE REPLACEMENT (AVR) (N/A) TRANSESOPHAGEAL ECHOCARDIOGRAM (TEE) (N/A)  Doing well POD1 Maintaining NSR w/ stable hemodynamics, no drips Breathing comfortably w/ O2 sats 97% on 2 L/min Expected post op acute blood loss anemia, Hgb 9.6 stable Expected post op volume excess, weight reportedly 12 lbs > preop, UOP adequate Expected post op atelectasis, R>L LBBB (new)   Mobilize  Diuresis  Hold beta blockers  D/C lines  Add toradol for pain control  Rexene Alberts, MD 05/19/2018 9:09 AM

## 2018-05-19 NOTE — Plan of Care (Signed)
  Problem: Clinical Measurements: Goal: Postoperative complications will be avoided or minimized Outcome: Progressing   Problem: Respiratory: Goal: Respiratory status will improve Outcome: Progressing  Pt extubated 6/25 Problem: Cardiac: Goal: Hemodynamic stability will improve Outcome: Progressing  Pt maintaining hemodynamics. No IV gtts

## 2018-05-19 NOTE — Progress Notes (Signed)
CT surgery p.m. Rounds  Patient more comfortable this evening with less pain Sitting up in chair Stable hemodynamics with paced rhythm P.m. Labs satisfactory

## 2018-05-19 NOTE — Discharge Summary (Signed)
Physician Discharge Summary       [address]       [place] [address]             [phone]    Patient ID: Devon Novak MRN: [id number] DOB/AGE: 05/22/1946 72 y.o.  Admit date: 05/18/2018 Discharge date: 05/26/2018  Admission Diagnoses: Severe aortic stenosis  Discharge Diagnoses:  1. S/P aortic valve replacement with bioprosthetic valve 2. ABL anemia 3. History of obstructive sleep apnea 4. History of hyperlipidemia 5. History of asthma 6. History of GERD (gastroesophageal reflux disease) 7. History of Hepatitis 8. History of hiatal hernia 9. History of obesity 10. History of tobacco abuse   Procedure (s):   Minimally Invasive Aortic Valve Replacement Right Anterior Mini-thoracotomy Edwards Intuity Elite Stented Bovine Pericardial Tissue Valve (size 23 mm, model #8300AB, serial #[id number]) Plating of right 2nd costal cartilage (KLS titanium plate) by Dr. Roxy Manns on 05/18/2018.   History of Presenting Illness: Patient has a 72 year old moderately obese male with history of aortic stenosis, hyperlipidemia, and obstructive sleep apnea who has been referred for surgical consultation to discuss treatment options for management of severe symptomatic aortic stenosis.  Patient states that he has known of the presence of a heart murmur for several years. He has been followed by Dr. Wynonia Lawman and more recently by Dr. Burt Knack for aortic stenosis. Previous echocardiograms revealed findings suggestive of moderate aortic stenosis and the patient has remained asymptomatic. Over the past several months patient has developed noticeable progression of symptoms of exertional shortness of breath, chest tightness, and fatigue. Recent follow-up echocardiogram performed at Dr. Thurman Coyer office on Apr 05, 2018 revealed significant progression of disease with severe aortic stenosis. Peak velocity across the aortic valve was reported 5.1 m/s corresponding to mean transvalvular  gradients estimated 63 mmHg. Left ventricular systolic function remain normal with ejection fraction calculated 60%. The patient was seen in follow-up by Dr. Burt Knack and underwent diagnostic cardiac catheterization on Apr 20, 2018. Catheterization confirmed the presence of severe aortic stenosis with mean transvalvular gradient measured 68 mmHg corresponding to aortic valve area calculated 0.74 cm. There was widely patent coronary arteries with no significant coronary artery disease. Right heart pressures were normal. CT angiography was performed and cardiothoracic surgical consultation was requested.  The patient is married and lives locally in [place]. He has been retired for several years and also retired from the [employer]. He has remained physically active and healthy all of his adult life. He enjoys hiking and backpacking, and his recentlyas last year was hiking on the [place] trail. He describes a long history of mild symptoms of exertional shortness of breath typically brought on only with more strenuous exertion. Over the last few months the symptoms have progressed dramatically such that he now gets short of breath and tightness across his chest with moderate level activity. He has not had any resting shortness of breath, PND, orthopnea, or lower extremity edema. He has not had palpitations, dizzy spells, nor syncope.  Brief Hospital Course:  The patient was extubated the evening of surgery without difficulty. He remained afebrile and hemodynamically stable. Devon Novak, a line, and foley were removed early in the post operative course. Epicardial pacing wires were removed on 06/28. Chest tubes remained for a few days and once output decreased, they  were removed on 06/29. He had a new LBBB post op. He was not on a beta blocker. He maintained sinus rhythm in the 60's-low 70's. He was volume over loaded and diuresed.  He had ABL anemia. He did not require a post op transfusion. Last H  and H was 10.9/32.8. He was weaned off the insulin drip.  The patient's HGA1C pre op was 5.4. The patient was felt surgically stable for transfer from the ICU to [facility] for further convalescence on 05/20/2018. He continues to progress with cardiac rehab. He was ambulating on room air. He has been tolerating a diet and has had a bowel movement. Chest tube sutures will be removed in the office after discharge. The patient is felt surgically stable for discharge today.   Latest Vital Signs: Blood pressure 125/65, pulse 62, temperature 98.6 F (37 C), temperature source Oral, resp. rate 20, height 5\' 9"  (1.753 m), weight 97.5 kg (214 lb 15.2 oz), SpO2 95 %.  Physical Exam:   General appearance: alert, cooperative and no distress Heart: regular rate and rhythm, S1, S2 normal, no murmur, click, rub or gallop Lungs: clear to auscultation bilaterally Abdomen: soft, non-tender; bowel sounds normal; no masses,  no organomegaly Extremities: extremities normal, atraumatic, no cyanosis or edema Wound: clean and dry     Discharge Condition: Stable and discharged to home.  Recent laboratory studies:  Lab Results  Component Value Date   WBC 10.1 05/25/2018   HGB 10.9 (L) 05/25/2018   HCT 32.8 (L) 05/25/2018   MCV 90.4 05/25/2018   PLT 319 05/25/2018   Lab Results  Component Value Date   NA 134 (L) 05/25/2018   K 4.9 05/25/2018   CL 98 05/25/2018   CO2 28 05/25/2018   CREATININE 1.17 05/25/2018   GLUCOSE 113 (H) 05/25/2018      Diagnostic Studies: Dg Chest 2 View  Result Date: 05/24/2018 CLINICAL DATA:  Right-sided chest pain since chest tube removal today. EXAM: CHEST - 2 VIEW COMPARISON:  Portable chest x-ray of May 24, 2018 prior chest tube removal. FINDINGS: The tiny right apical pneumothorax persists and is stable. The patchy density in the right mid and lower lung follows the former chest tube tract. No large pleural effusion is observed. There is a trace of pleural fluid on the left.  Otherwise the left lung is clear. The heart and pulmonary vascularity are normal. A orthopedic fixation plate is present overlying the right second costal cartilage extending to the sternum. IMPRESSION: No interval increase in the size of the tiny right apical pneumothorax. Stable density in the region of the former course of the now removed right chest tube. Electronically Signed   By: David  Martinique M.D.   On: 05/24/2018 13:16   Dg Chest 2 View  Result Date: 05/14/2018 CLINICAL DATA:  Preop evaluation for upcoming aortic surgery EXAM: CHEST - 2 VIEW COMPARISON:  06/26/2017 FINDINGS: Cardiac shadow is stable. The lungs are well aerated bilaterally. No focal infiltrate or sizable effusion is seen. Mild degenerative change of the thoracic spine is seen. IMPRESSION: No acute abnormality noted. Electronically Signed   By: Inez Catalina M.D.   On: 05/14/2018 12:37   Dg Chest Port 1 View  Result Date: 05/24/2018 CLINICAL DATA:  Pneumothorax with chest tube in place EXAM: PORTABLE CHEST 1 VIEW COMPARISON:  May 22, 2018 FINDINGS: Chest tube remains on the right with small right apical pneumothorax, essentially stable. There is opacification at the level of the chest tube on the right, likely atelectasis or contusion. Lungs elsewhere are clear. The heart size and pulmonary vascularity are normal. No adenopathy. There is postoperative change on the right, stable. Patient is status post aortic valve replacement. IMPRESSION:  Persistent small right apical pneumothorax without tension component. Chest tube position on the right stable. Question contusion at chest tube placement site, stable. No edema or consolidation. Status post aortic valve replacement. Stable cardiac silhouette. Electronically Signed   By: Lowella Grip III M.D.   On: 05/24/2018 07:14   Dg Chest Port 1 View  Result Date: 05/22/2018 CLINICAL DATA:  Pleural effusion with chest tube in place EXAM: PORTABLE CHEST 1 VIEW COMPARISON:  Two days ago  FINDINGS: Small right apical pneumothorax, less than 10%. Chest tubes are in stable position. Mild streaky density at the bases. Cardiomegaly. Status post aortic valve replacement. IMPRESSION: 1. Small right apical pneumothorax without interval change. Stable chest tube positioning. 2. Mild atelectasis at the bases. Electronically Signed   By: Monte Fantasia M.D.   On: 05/22/2018 08:41   Dg Chest Port 1 View  Result Date: 05/20/2018 CLINICAL DATA:  Status post cardiac surgery EXAM: PORTABLE CHEST 1 VIEW COMPARISON:  05/19/2018 FINDINGS: Cardiac shadow is mildly enlarged. Postsurgical changes are again seen and stable. Pericardial drain and right thoracostomy catheter are again noted and stable. Swan-Ganz catheter has been removed in the interval. Persistent right basilar atelectasis is seen. Tiny right apical pneumothorax is now seen. No other focal abnormality is noted. IMPRESSION: Postsurgical changes. Tubes and lines as described. Interval development of a small right pneumothorax. These results will be called to the ordering clinician or representative by the Radiologist Assistant, and communication documented in the PACS or zVision Dashboard. Electronically Signed   By: Inez Catalina M.D.   On: 05/20/2018 09:45   Dg Chest Port 1 View  Result Date: 05/19/2018 CLINICAL DATA:  Status post aortic valve repair EXAM: PORTABLE CHEST 1 VIEW COMPARISON:  05/18/2018 FINDINGS: Devon Novak catheter is again noted. Changes consistent with recent aortic valve repair are again noted. Pericardial drain and right thoracostomy drain are again seen. No pneumothorax is noted. Lungs are well aerated bilaterally. Mild bibasilar atelectasis is noted. Endotracheal tube and nasogastric catheter been removed in the interval. IMPRESSION: Stable bibasilar atelectasis.  No pneumothorax is noted. Postsurgical changes as described. Electronically Signed   By: Inez Catalina M.D.   On: 05/19/2018 09:08   Dg Chest Port 1 View  Result  Date: 05/18/2018 CLINICAL DATA:  Status post aortic valve replacement. EXAM: PORTABLE CHEST 1 VIEW COMPARISON:  05/14/2018. FINDINGS: Endotracheal tube in satisfactory position. Left jugular Swan-Ganz catheter tip in the proximal right main pulmonary artery. Prosthetic aortic valve. Nasogastric tube extending into the stomach without clear visualization of the tip. Mediastinal and right chest tubes without pneumothorax. Poor inspiration with bibasilar atelectasis. Grossly normal sized heart. Thoracolumbar spine degenerative changes. IMPRESSION: Poor inspiration with bibasilar atelectasis. Electronically Signed   By: Claudie Revering M.D.   On: 05/18/2018 14:45       Discharge Instructions    Amb Referral to Cardiac Rehabilitation   Complete by:  As directed    Diagnosis:  Valve Repair   Valve:  Aortic      Discharge Medications: Allergies as of 05/26/2018   No Known Allergies     Medication List    STOP taking these medications   Co Q10 100 MG Caps   GLUCOSAMINE CHOND MSM FORMULA PO   ibuprofen 200 MG tablet Commonly known as:  ADVIL,MOTRIN   Omega-3 350 MG Cpdr   Pomegranate Extract 250 MG Caps   SAW PALMETTO PO   sildenafil 20 MG tablet Commonly known as:  REVATIO   TURMERIC PO  zolpidem 10 MG tablet Commonly known as:  AMBIEN     TAKE these medications   acetaminophen 325 MG tablet Commonly known as:  TYLENOL Take 2 tablets (650 mg total) by mouth every 6 (six) hours as needed for mild pain or fever.   aspirin 325 MG EC tablet Take 1 tablet (325 mg total) by mouth daily. What changed:    medication strength  how much to take   atorvastatin 40 MG tablet Commonly known as:  LIPITOR Take 40 mg by mouth every evening.   B-12 2500 MCG Tabs Take 2,500 mcg by mouth daily.   finasteride 5 MG tablet Commonly known as:  PROSCAR Take 5 mg by mouth every evening.   LIQUID TEARS OP Place 1 drop into both eyes daily as needed (dry eyes).   magnesium oxide 400 MG  tablet Commonly known as:  MAG-OX Take 400 mg by mouth daily.   metoprolol tartrate 25 MG tablet Commonly known as:  LOPRESSOR Take 0.5 tablets (12.5 mg total) by mouth 2 (two) times daily.   MULTIVITAMIN ADULT PO Take 1 tablet by mouth daily.   oxyCODONE 5 MG immediate release tablet Commonly known as:  Oxy IR/ROXICODONE Take 1 tablet (5 mg total) by mouth every 6 (six) hours as needed for severe pain.   Probiotic Caps Take 1 capsule by mouth daily.   ranitidine 150 MG tablet Commonly known as:  ZANTAC Take 150 mg by mouth daily as needed for heartburn.   VISINE OP Apply 1 drop to eye daily as needed (dry eyes).   Vitamin D-3 5000 units Tabs Take 5,000 Units by mouth daily.      The patient has been discharged on:   1.Beta Blocker:  Yes [  x ]                              No   [   ]                              If No, reason:  2.Ace Inhibitor/ARB: Yes [   ]                                     No  [  x  ]                                     If No, reason: titration of BB  3.Statin:   Yes [ x  ]                  No  [   ]                  If No, reason:  4.Ecasa:  Yes  [ x  ]                  No   [   ]                  If No, reason:  Follow Up Appointments: Follow-up Information    Rexene Alberts, MD. Go to.   Specialty:  Cardiothoracic Surgery Why:  PA/LAT CXR to be taken (at [facility] which  is in the same building as Dr. Guy Sandifer office) on 06/21/2018 at 1:30 pm;Appointment time is at 2:00 pm Contact information: [address] [place] [address] [phone]        Richardson Dopp T, [place]. Go on 06/14/2018.   Specialties:  Cardiology, Physician Assistant Why:  Appointment time is at at 11:45 am  Contact information: [address] [place] [address] [phone]        [name], MD. Call in 1 day(s).   Specialty:  Family Medicine Contact information: [place]  [address] [phone]           Signed: Alvester Chou 05/26/2018, 12:44 PM

## 2018-05-19 NOTE — Discharge Instructions (Signed)
Activity: 1.May walk up steps                2.No lifting more than ten pounds for four weeks.                 3.No driving for four weeks.                4.Stop any activity that causes chest pain, shortness of breath, dizziness, sweating or excessive weakness.                5.Avoid straining.                6.Continue with your breathing exercises daily.  Diet: Heart healthy (low fat, low salt)  Wound Care: May shower.  Clean wounds with mild soap and water daily. Contact the office at [phone] if any problems arise.

## 2018-05-20 ENCOUNTER — Inpatient Hospital Stay (HOSPITAL_COMMUNITY): Payer: Medicare Other

## 2018-05-20 LAB — GLUCOSE, CAPILLARY
GLUCOSE-CAPILLARY: 124 mg/dL — AB (ref 70–99)
Glucose-Capillary: 100 mg/dL — ABNORMAL HIGH (ref 70–99)
Glucose-Capillary: 119 mg/dL — ABNORMAL HIGH (ref 70–99)
Glucose-Capillary: 95 mg/dL (ref 70–99)
Glucose-Capillary: 110 mg/dL — ABNORMAL HIGH (ref 70–99)

## 2018-05-20 LAB — CBC
HCT: 26.8 % — ABNORMAL LOW (ref 39.0–52.0)
HEMOGLOBIN: 8.8 g/dL — AB (ref 13.0–17.0)
MCH: 30.1 pg (ref 26.0–34.0)
MCHC: 32.8 g/dL (ref 30.0–36.0)
MCV: 91.8 fL (ref 78.0–100.0)
Platelets: 139 10*3/uL — ABNORMAL LOW (ref 150–400)
RBC: 2.92 MIL/uL — ABNORMAL LOW (ref 4.22–5.81)
RDW: 14.2 % (ref 11.5–15.5)
WBC: 11.5 10*3/uL — ABNORMAL HIGH (ref 4.0–10.5)

## 2018-05-20 LAB — BASIC METABOLIC PANEL
ANION GAP: 3 — AB (ref 5–15)
BUN: 20 mg/dL (ref 8–23)
CALCIUM: 7.6 mg/dL — AB (ref 8.9–10.3)
CO2: 27 mmol/L (ref 22–32)
CREATININE: 1.18 mg/dL (ref 0.61–1.24)
Chloride: 105 mmol/L (ref 98–111)
GFR calc Af Amer: >60 mL/min (ref >60)
Glucose, Bld: 107 mg/dL — ABNORMAL HIGH (ref 70–99)
Potassium: 4.2 mmol/L (ref 3.5–5.1)
Sodium: 135 mmol/L (ref 135–145)

## 2018-05-20 MED ORDER — FUROSEMIDE 40 MG PO TABS
40.0000 mg | ORAL_TABLET | Freq: Two times a day (BID) | ORAL | Status: AC
  Administered 2018-05-20 – 2018-05-22 (×6): 40 mg via ORAL
  Filled 2018-05-20 (×6): qty 1

## 2018-05-20 MED ORDER — MOVING RIGHT ALONG BOOK
Freq: Once | Status: AC
  Administered 2018-05-20: 07:00:00
  Filled 2018-05-20: qty 1

## 2018-05-20 MED ORDER — POTASSIUM CHLORIDE CRYS ER 20 MEQ PO TBCR
20.0000 meq | EXTENDED_RELEASE_TABLET | Freq: Two times a day (BID) | ORAL | Status: AC
  Administered 2018-05-20 – 2018-05-22 (×6): 20 meq via ORAL
  Filled 2018-05-20 (×6): qty 1

## 2018-05-20 MED ORDER — FUROSEMIDE 40 MG PO TABS: 40.0000 mg | ORAL_TABLET | Freq: Every day | ORAL | Status: DC

## 2018-05-20 MED ORDER — ALUM & MAG HYDROXIDE-SIMETH 200-200-20 MG/5ML PO SUSP: 15.0000 mL | Freq: Three times a day (TID) | ORAL | Status: DC | PRN

## 2018-05-20 MED ORDER — POTASSIUM CHLORIDE CRYS ER 20 MEQ PO TBCR: 20.0000 meq | EXTENDED_RELEASE_TABLET | Freq: Every day | ORAL | Status: DC

## 2018-05-20 MED ORDER — FAMOTIDINE 20 MG PO TABS
20.0000 mg | ORAL_TABLET | Freq: Two times a day (BID) | ORAL | Status: DC
  Administered 2018-05-20 – 2018-05-26 (×13): 20 mg via ORAL
  Filled 2018-05-20 (×13): qty 1

## 2018-05-20 NOTE — Progress Notes (Signed)
      [address]       [place] [address]             [phone]        CARDIOTHORACIC SURGERY PROGRESS NOTE   R2 Days Post-Op Procedure(s) (LRB): MINIMALLY INVASIVE AORTIC VALVE REPLACEMENT (AVR) (N/A) TRANSESOPHAGEAL ECHOCARDIOGRAM (TEE) (N/A)  Subjective: Doing well.  Ambulated around ICU.  Minimal pain this morning  Objective: Vital signs: BP Readings from Last 1 Encounters:  05/20/18 (!) 122/47   Pulse Readings from Last 1 Encounters:  05/18/18 66   Resp Readings from Last 1 Encounters:  05/20/18 17   Temp Readings from Last 1 Encounters:  05/20/18 99.6 F (37.6 C) (Oral)    Hemodynamics:    Physical Exam:  Rhythm:   sinus  Breath sounds: clear  Heart sounds:  RRR  Incisions:  Dressings dry, intact  Abdomen:  Soft, non-distended, non-tender  Extremities:  Warm, well-perfused  Chest tubes:  Decreasing but significant volume thin serosanguinous output, no air leak    Intake/Output from previous day: 06/26 0701 - 06/27 0700 In: 877.9 [P.O.:240; I.V.:537.8; IV Piggyback:100.1] Out: 3151 [Urine:835; Chest Tube:720] Intake/Output this shift: No intake/output data recorded.  Lab Results:  CBC: Recent Labs    05/19/18 1644 05/19/18 1719 05/20/18 0550  WBC 14.8*  --  11.5*  HGB 10.0* 9.5* 8.8*  HCT 30.2* 28.0* 26.8*  PLT 150  --  139*    BMET:  Recent Labs    05/19/18 0330  05/19/18 1719 05/20/18 0550  NA 138  --  137 135  K 4.1  --  4.2 4.2  CL 109  --  100 105  CO2 24  --   --  27  GLUCOSE 108*  --  134* 107*  BUN 14  --  18 20  CREATININE 0.92   < > 1.30* 1.18  CALCIUM 7.3*  --   --  7.6*   < > = values in this interval not displayed.     PT/INR:   Recent Labs    05/18/18 1415  LABPROT 17.7*  INR 1.47    CBG (last 3)  Recent Labs    05/19/18 2352 05/20/18 0421 05/20/18 0732  GLUCAP 124* 100* 119*    ABG    Component Value Date/Time   PHART 7.357 05/18/2018 2248   PCO2ART 39.5 05/18/2018 2248   PO2ART 113.0 (H) 05/18/2018 2248   HCO3 22.1 05/18/2018 2248   TCO2 25 05/19/2018 1719   ACIDBASEDEF 3.0 (H) 05/18/2018 2248   O2SAT 98.0 05/18/2018 2248    CXR: clear  Assessment/Plan: S/P Procedure(s) (LRB): MINIMALLY INVASIVE AORTIC VALVE REPLACEMENT (AVR) (N/A) TRANSESOPHAGEAL ECHOCARDIOGRAM (TEE) (N/A)  Doing well POD2 Maintaining NSR w/ stable BPs Breathing comfortably w/ O2 sats 97% on RA Expected post op acute blood loss anemia, Hgb 8.8 Expected post op volume excess, weight reportedly 12 lbs > preop, UOP adequate Expected post op atelectasis, R>L LBBB (new)   Mobilize  Diuresis  Hold beta blockers  Leave chest tubes in place until drainage decreases further  Tranfer 4E   Rexene Alberts, MD 05/20/2018 8:49 AM

## 2018-05-21 LAB — BASIC METABOLIC PANEL
Anion gap: 8 (ref 5–15)
BUN: 17 mg/dL (ref 8–23)
CHLORIDE: 102 mmol/L (ref 98–111)
CO2: 26 mmol/L (ref 22–32)
CREATININE: 1.08 mg/dL (ref 0.61–1.24)
Calcium: 8.1 mg/dL — ABNORMAL LOW (ref 8.9–10.3)
GFR calc Af Amer: >60 mL/min (ref >60)
GFR calc non Af Amer: >60 mL/min (ref >60)
GLUCOSE: 108 mg/dL — AB (ref 70–99)
POTASSIUM: 3.9 mmol/L (ref 3.5–5.1)
Sodium: 136 mmol/L (ref 135–145)

## 2018-05-21 LAB — CBC
HEMATOCRIT: 30.4 % — AB (ref 39.0–52.0)
HEMOGLOBIN: 10.2 g/dL — AB (ref 13.0–17.0)
MCH: 30.5 pg (ref 26.0–34.0)
MCHC: 33.6 g/dL (ref 30.0–36.0)
MCV: 91 fL (ref 78.0–100.0)
Platelets: 177 10*3/uL (ref 150–400)
RBC: 3.34 MIL/uL — AB (ref 4.22–5.81)
RDW: 13.8 % (ref 11.5–15.5)
WBC: 10.9 10*3/uL — ABNORMAL HIGH (ref 4.0–10.5)

## 2018-05-21 NOTE — Progress Notes (Addendum)
      [address]       [place] [address]             [phone]        3 Days Post-Op Procedure(s) (LRB): MINIMALLY INVASIVE AORTIC VALVE REPLACEMENT (AVR) (N/A) TRANSESOPHAGEAL ECHOCARDIOGRAM (TEE) (N/A)  Subjective: Patient without specific complaints this am.  Objective: Vital signs in last 24 hours: Temp:  [98.3 F (36.8 C)-99.7 F (37.6 C)] 98.3 F (36.8 C) (06/28 0507) Pulse Rate:  [62-70] 62 (06/28 0507) Cardiac Rhythm: Normal sinus rhythm;Bundle branch block (06/27 1900) Resp:  [13-22] 17 (06/28 0507) BP: (111-135)/(64-81) 120/66 (06/28 0507) SpO2:  [91 %-99 %] 98 % (06/28 0507) Weight:  [229 lb 1.6 oz (103.9 kg)] 229 lb 1.6 oz (103.9 kg) (06/28 0507)  Pre op weight 100.7 kg Current Weight  05/21/18 229 lb 1.6 oz (103.9 kg)      Intake/Output from previous day: 06/27 0701 - 06/28 0700 In: 605.8 [P.O.:360; I.V.:145.8; IV Piggyback:100] Out: 1915 [Urine:1525; Chest Tube:390]   Physical Exam:  Cardiovascular: RRR Pulmonary: Slightly diminished left base and right lung is mostly clear Abdomen: Soft, non tender, bowel sounds present. Extremities: Mild bilateral lower extremity edema. Wounds: Aquacel removed and wound is clean and dry.  No erythema or signs of infection.  Lab Results: CBC: Recent Labs    05/19/18 1644 05/19/18 1719 05/20/18 0550  WBC 14.8*  --  11.5*  HGB 10.0* 9.5* 8.8*  HCT 30.2* 28.0* 26.8*  PLT 150  --  139*   BMET:  Recent Labs    05/19/18 0330  05/19/18 1719 05/20/18 0550  NA 138  --  137 135  K 4.1  --  4.2 4.2  CL 109  --  100 105  CO2 24  --   --  27  GLUCOSE 108*  --  134* 107*  BUN 14  --  18 20  CREATININE 0.92   < > 1.30* 1.18  CALCIUM 7.3*  --   --  7.6*   < > = values in this interval not displayed.    PT/INR:  Lab Results  Component Value Date   INR 1.47 05/18/2018   INR 1.07 05/14/2018   ABG:  INR: Will add last result for INR, ABG once components are confirmed Will add last  4 CBG results once components are confirmed  Assessment/Plan:  1. CV - SR in the low 60's. Has a new LBBB. Not on BB. 2.  Pulmonary - Chest tube output 390 cc last 24 hours. Chest tubes to remain for now;hope to remove once output decreased. On room air. Encourage incentive spirometer. 3. Volume Overload - On Lasix 40 mg bid 4.  Acute blood loss anemia - H and H yesterday 8.8 and 26.8 5. Mild thrombocytopenia-platelets yesterday 139,000 6.As discussed with Dr. Roxy Manns, will remove EPW  Donielle M ZimmermanPA-C 05/21/2018,7:06 AM [phone]  I have seen and examined the patient and agree with the assessment and plan as outlined.  Making good progress.  Will leave tubes in 1 more day.  Anticipate likely d/c tubes tomorrow and possible d/c home on Sunday.  No beta blockers due to new LBBB  Rexene Alberts, MD 05/21/2018 3:03 PM

## 2018-05-21 NOTE — Anesthesia Postprocedure Evaluation (Signed)
Anesthesia Post Note  Patient: Devon Novak  Procedure(s) Performed: MINIMALLY INVASIVE AORTIC VALVE REPLACEMENT (AVR) (N/A Chest) TRANSESOPHAGEAL ECHOCARDIOGRAM (TEE) (N/A )     Patient location during evaluation: SICU Anesthesia Type: General Level of consciousness: sedated Pain management: pain level controlled Vital Signs Assessment: post-procedure vital signs reviewed and stable Respiratory status: patient remains intubated per anesthesia plan Cardiovascular status: stable Postop Assessment: no apparent nausea or vomiting Anesthetic complications: no    Last Vitals:  Vitals:   05/21/18 1230 05/21/18 1245  BP: (!) 150/81 133/72  Pulse:    Resp: 19 20  Temp:    SpO2:      Last Pain:  Vitals:   05/21/18 1750  TempSrc:   PainSc: 2                  Keundra Petrucelli

## 2018-05-21 NOTE — Progress Notes (Signed)
Pacer wires removed and intact. Vitals monitored every 15 mins. Pt advised to remain in bed for bedrest 1 hour and 15 mins. Jerald Kief, RN

## 2018-05-21 NOTE — Progress Notes (Signed)
CARDIAC REHAB PHASE I   PRE:  Rate/Rhythm: 33 SR with BBB  BP:  Sitting: 133/72      SaO2: 98 RA  MODE:  Ambulation: >1,700 ft   POST:  Rate/Rhythm: 90 SR with BBB  BP:  Sitting: 146/78    SaO2: 98 RA   Pt ambulated >1,728ft in hallway independently with front wheel rolling walker. No c/o SOB, but pt "notices" more effort. Pt given heart healthy diet, encouraged IS use, and reviewed exercise guidelines. Will refer to CRP II [place]. Will continue to follow.  [phone] Rufina Falco, RN BSN 05/21/2018 2:05 PM

## 2018-05-21 NOTE — Care Management Important Message (Signed)
Important Message  Patient Details  Name: Devon Novak MRN: [id number] Date of Birth: [birth year]   Medicare Important Message Given:  Yes    Barb Merino Amarys Sliwinski 05/21/2018, 3:22 PM

## 2018-05-22 ENCOUNTER — Inpatient Hospital Stay (HOSPITAL_COMMUNITY): Payer: Medicare Other

## 2018-05-22 LAB — TYPE AND SCREEN
ABO/RH(D): A NEG
Antibody Screen: NEGATIVE
Unit division: 0
Unit division: 0

## 2018-05-22 LAB — BPAM RBC
BLOOD PRODUCT EXPIRATION DATE: 201907112359
BLOOD PRODUCT EXPIRATION DATE: 201907112359
ISSUE DATE / TIME: 201906250901
ISSUE DATE / TIME: 201906250901
UNIT TYPE AND RH: 600
Unit Type and Rh: 600

## 2018-05-22 NOTE — Progress Notes (Signed)
Pt has walked x2 today, once with NT, and now independently with RW. He looks and feels good. Encouraged walking independently and I adjusted his RW.  [phone] Yves Dill CES, ACSM 11:22 AM 05/22/2018

## 2018-05-22 NOTE — Progress Notes (Addendum)
[address]       [place] [address]             [phone]      4 Days Post-Op Procedure(s) (LRB): MINIMALLY INVASIVE AORTIC VALVE REPLACEMENT (AVR) (N/A) TRANSESOPHAGEAL ECHOCARDIOGRAM (TEE) (N/A) Subjective: Feels ok, minor nausea at times, some incisional discomfort. + BM  Objective: Vital signs in last 24 hours: Temp:  [98.6 F (37 C)-99.2 F (37.3 C)] 99.2 F (37.3 C) (06/28 2345) Pulse Rate:  [70-79] 70 (06/28 2345) Cardiac Rhythm: Normal sinus rhythm;Bundle branch block (06/28 1932) Resp:  [17-20] 17 (06/28 2345) BP: (110-150)/(72-90) 110/79 (06/28 2345) SpO2:  [96 %-99 %] 99 % (06/28 2345)  Hemodynamic parameters for last 24 hours:    Intake/Output from previous day: 06/28 0701 - 06/29 0700 In: 600 [P.O.:600] Out: 3905 [Urine:3325; Chest Tube:580] Intake/Output this shift: No intake/output data recorded.  General appearance: alert, cooperative and no distress Heart: regular rate and rhythm and + rub, ? S3 gallop Lungs: clear to auscultation bilaterally Abdomen: benign Extremities: no edema Wound: incis with echymosis, mild tenderness , no drainage/erethema  Lab Results: Recent Labs    05/20/18 0550 05/21/18 0638  WBC 11.5* 10.9*  HGB 8.8* 10.2*  HCT 26.8* 30.4*  PLT 139* 177   BMET:  Recent Labs    05/20/18 0550 05/21/18 0638  NA 135 136  K 4.2 3.9  CL 105 102  CO2 27 26  GLUCOSE 107* 108*  BUN 20 17  CREATININE 1.18 1.08  CALCIUM 7.6* 8.1*    PT/INR: No results for input(s): LABPROT, INR in the last 72 hours. ABG    Component Value Date/Time   PHART 7.357 05/18/2018 2248   HCO3 22.1 05/18/2018 2248   TCO2 25 05/19/2018 1719   ACIDBASEDEF 3.0 (H) 05/18/2018 2248   O2SAT 98.0 05/18/2018 2248   CBG (last 3)  Recent Labs    05/20/18 0732 05/20/18 1218 05/20/18 1555  GLUCAP 119* 95 110*    Meds Scheduled Meds: . acetaminophen  1,000 mg Oral Q6H  . aspirin EC  325 mg Oral Daily  . atorvastatin  40  mg Oral QPM  . bisacodyl  10 mg Oral Daily   Or  . bisacodyl  10 mg Rectal Daily  . docusate sodium  200 mg Oral Daily  . famotidine  20 mg Oral BID  . finasteride  5 mg Oral QPM  . furosemide  40 mg Oral BID  . mouth rinse  15 mL Mouth Rinse BID  . pantoprazole  40 mg Oral Daily  . potassium chloride  20 mEq Oral BID  . sodium chloride flush  3 mL Intravenous Q12H   Continuous Infusions: . sodium chloride Stopped (05/20/18 1600)  . lactated ringers Stopped (05/18/18 1400)  . lactated ringers Stopped (05/20/18 0800)   PRN Meds:.alum & mag hydroxide-simeth, metoprolol tartrate, morphine injection, ondansetron (ZOFRAN) IV, oxyCODONE, sodium chloride flush, traMADol  Xrays No results found.  Assessment/Plan: S/P Procedure(s) (LRB): MINIMALLY INVASIVE AORTIC VALVE REPLACEMENT (AVR) (N/A) TRANSESOPHAGEAL ECHOCARDIOGRAM (TEE) (N/A)  1 overall doing well 2 BP control overall is good 3 LBBB, some PVC's, short burst SVT- monitor, no beta blocker for now, asymptomatic 4 sats good on RA, cont pulm toilet 5 CT no air leak but still mod drainage, 590 yesterday, 380 so far today- leave in place, small pntx also, leave to suction 6 routine cardiac rehab  LOS: 4 days    Devon Novak 05/22/2018  I  have seen and examined Orson Aloe and agree with the above assessment  and plan.  Grace Isaac MD Beeper [phone] Office [phone] 05/22/2018 6:07 PM

## 2018-05-23 NOTE — Progress Notes (Addendum)
[name]       [place] [address]             [phone]      5 Days Post-Op Procedure(s) (LRB): MINIMALLY INVASIVE AORTIC VALVE REPLACEMENT (AVR) (N/A) TRANSESOPHAGEAL ECHOCARDIOGRAM (TEE) (N/A) Subjective: Feels well, no new c/o  Objective: Vital signs in last 24 hours: Temp:  [98.1 F (36.7 C)-98.5 F (36.9 C)] 98.5 F (36.9 C) (06/30 0839) Pulse Rate:  [78] 78 (06/29 1210) Cardiac Rhythm: Normal sinus rhythm;Bundle branch block (06/30 0700) Resp:  [19-20] 19 (06/30 0839) BP: (119-132)/(68-89) 119/69 (06/30 0839) SpO2:  [98 %-99 %] 99 % (06/30 0839) Weight:  [99.4 kg (219 lb 3.2 oz)] 99.4 kg (219 lb 3.2 oz) (06/30 0417)  Hemodynamic parameters for last 24 hours:    Intake/Output from previous day: 06/29 0701 - 06/30 0700 In: 1140 [P.O.:1140] Out: 2410 [Urine:2270; Chest Tube:140] Intake/Output this shift: Total I/O In: 240 [P.O.:240] Out: 200 [Urine:200]  General appearance: alert, cooperative and no distress Heart: regular rate and rhythm Lungs: clear to auscultation bilaterally Abdomen: benign exam Extremities: no edema Wound: incis healing well  Lab Results: Recent Labs    05/21/18 0638  WBC 10.9*  HGB 10.2*  HCT 30.4*  PLT 177   BMET:  Recent Labs    05/21/18 0638  NA 136  K 3.9  CL 102  CO2 26  GLUCOSE 108*  BUN 17  CREATININE 1.08  CALCIUM 8.1*    PT/INR: No results for input(s): LABPROT, INR in the last 72 hours. ABG    Component Value Date/Time   PHART 7.357 05/18/2018 2248   HCO3 22.1 05/18/2018 2248   TCO2 25 05/19/2018 1719   ACIDBASEDEF 3.0 (H) 05/18/2018 2248   O2SAT 98.0 05/18/2018 2248   CBG (last 3)  Recent Labs    05/20/18 1218 05/20/18 1555  GLUCAP 95 110*    Meds Scheduled Meds: . acetaminophen  1,000 mg Oral Q6H  . aspirin EC  325 mg Oral Daily  . atorvastatin  40 mg Oral QPM  . bisacodyl  10 mg Oral Daily   Or  . bisacodyl  10 mg Rectal Daily  . docusate sodium  200 mg  Oral Daily  . famotidine  20 mg Oral BID  . finasteride  5 mg Oral QPM  . mouth rinse  15 mL Mouth Rinse BID  . pantoprazole  40 mg Oral Daily  . sodium chloride flush  3 mL Intravenous Q12H   Continuous Infusions: . sodium chloride Stopped (05/20/18 1600)  . lactated ringers Stopped (05/18/18 1400)  . lactated ringers Stopped (05/20/18 0800)   PRN Meds:.alum & mag hydroxide-simeth, metoprolol tartrate, morphine injection, ondansetron (ZOFRAN) IV, oxyCODONE, sodium chloride flush, traMADol  Xrays Dg Chest Port 1 View  Result Date: 05/22/2018 CLINICAL DATA:  Pleural effusion with chest tube in place EXAM: PORTABLE CHEST 1 VIEW COMPARISON:  Two days ago FINDINGS: Small right apical pneumothorax, less than 10%. Chest tubes are in stable position. Mild streaky density at the bases. Cardiomegaly. Status post aortic valve replacement. IMPRESSION: 1. Small right apical pneumothorax without interval change. Stable chest tube positioning. 2. Mild atelectasis at the bases. Electronically Signed   By: Monte Fantasia M.D.   On: 05/22/2018 08:41    Assessment/Plan: S/P Procedure(s) (LRB): MINIMALLY INVASIVE AORTIC VALVE REPLACEMENT (AVR) (N/A) TRANSESOPHAGEAL ECHOCARDIOGRAM (TEE) (N/A)  1 doing well overall. Hemodyn stable with LBBB 2 still mod chest tube drainage but improved (480cc yesterday)-  will d/c one chest tube today 3 repeat CXR in am- no air leak  4 good UOP- wt below preop, no current need for diuretics 5 no new labs 6 poss home 1-2 days  LOS: 5 days    Devon Novak 05/23/2018 I have seen and examined Devon Novak and agree with the above assessment  and plan.  Devon Isaac MD Beeper [phone] Office [phone] 05/23/2018 2:31 PM

## 2018-05-23 NOTE — Progress Notes (Signed)
Dc'ed chest tube pt tolerated well 

## 2018-05-24 ENCOUNTER — Inpatient Hospital Stay (HOSPITAL_COMMUNITY): Payer: Medicare Other

## 2018-05-24 LAB — ECHO TEE
AOASC: 3.6 cm
AVG: 54 mmHg
LVOT diameter: 22 mm
Mean grad: 1 mmHg
SINUS: 3.1 cm
STJ: 2.6 cm

## 2018-05-24 NOTE — Progress Notes (Signed)
R chest tube removed at 1040 am w/o difficulty.  Suture closed.  Scant clear/red drainage noted.  Dry dressing applied.  Tolerated procedure well.  99% on RA before and after chest tube removal.  Denies SOB.

## 2018-05-24 NOTE — Progress Notes (Signed)
CARDIAC REHAB PHASE I   PRE:  Rate/Rhythm: 103 ST  BP:  Sitting: 107/67      SaO2: 98 RA  MODE:  Ambulation: 1000 ft   POST:  Rate/Rhythm: 99 SR  BP:  Sitting: 116/71    SaO2: 97 RA   Pt ambulated 1040ft in hallway independently with front wheel walker. Re-reviewed discharged education. No further questions. Will continue to follow.  [phone] Rufina Falco, RN BSN 05/24/2018 9:19 AM

## 2018-05-24 NOTE — Progress Notes (Addendum)
      [address]       [place] [address]             [phone]      6 Days Post-Op Procedure(s) (LRB): MINIMALLY INVASIVE AORTIC VALVE REPLACEMENT (AVR) (N/A) TRANSESOPHAGEAL ECHOCARDIOGRAM (TEE) (N/A) Subjective: He feels okay other than some incisional pain.   Objective: Vital signs in last 24 hours: Temp:  [98.1 F (36.7 C)-99.1 F (37.3 C)] 99.1 F (37.3 C) (07/01 0436) Pulse Rate:  [76-85] 76 (07/01 0436) Cardiac Rhythm: Normal sinus rhythm (07/01 0707) Resp:  [12-19] 16 (07/01 0005) BP: (108-124)/(66-71) 108/71 (07/01 0005) SpO2:  [98 %-100 %] 98 % (07/01 0436) Weight:  [99.1 kg (218 lb 6.4 oz)] 99.1 kg (218 lb 6.4 oz) (07/01 0439)    Intake/Output from previous day: 06/30 0701 - 07/01 0700 In: 900 [P.O.:900] Out: 2330 [Urine:2100; Chest Tube:230] Intake/Output this shift: No intake/output data recorded.  General appearance: alert, cooperative and no distress Heart: regular rate and rhythm, S1, S2 normal, no murmur, click, rub or gallop Lungs: clear to auscultation bilaterally Extremities: extremities normal, atraumatic, no cyanosis or edema Wound: clean and dry with some surrounding ecchymosis  Lab Results: No results for input(s): WBC, HGB, HCT, PLT in the last 72 hours. BMET: No results for input(s): NA, K, CL, CO2, GLUCOSE, BUN, CREATININE, CALCIUM in the last 72 hours.  PT/INR: No results for input(s): LABPROT, INR in the last 72 hours. ABG    Component Value Date/Time   PHART 7.357 05/18/2018 2248   HCO3 22.1 05/18/2018 2248   TCO2 25 05/19/2018 1719   ACIDBASEDEF 3.0 (H) 05/18/2018 2248   O2SAT 98.0 05/18/2018 2248   CBG (last 3)  No results for input(s): GLUCAP in the last 72 hours.  Assessment/Plan: S/P Procedure(s) (LRB): MINIMALLY INVASIVE AORTIC VALVE REPLACEMENT (AVR) (N/A) TRANSESOPHAGEAL ECHOCARDIOGRAM (TEE) (N/A)  1. CV-NSR in the 90s, stable LBBB. BP well controlled. Continue ASA and statin.  2. Pulm-one chest  tube removed yesterday. Persistent right apical pneumo on CXR. No airleak. Will change the remaining chest tube to water seal with CXR in the morning. 3. Renal-creatinine 1.08, electrolytes okay. Diuretics discontinued due to euvolemic. 4. H and H is stable. Platelets trending up 5. Endo-blood glucose well controlled on current regimen.  Plan: Remaining chest tube to water seal and observe. Continue laxatives for constipation. Continue incentive spirometer use and ambulation around the unit.    LOS: 6 days    [name] 05/24/2018  I have seen and examined the patient and agree with the assessment and plan as outlined.  However, will d/c chest tube this morning.  Check CXR this afternoon after tube is out.  Possible d/c home later today or tomorrow.  Rexene Alberts, MD 05/24/2018 8:39 AM

## 2018-05-25 LAB — CBC
HEMATOCRIT: 32.8 % — AB (ref 39.0–52.0)
HEMOGLOBIN: 10.9 g/dL — AB (ref 13.0–17.0)
MCH: 30 pg (ref 26.0–34.0)
MCHC: 33.2 g/dL (ref 30.0–36.0)
MCV: 90.4 fL (ref 78.0–100.0)
Platelets: 319 10*3/uL (ref 150–400)
RBC: 3.63 MIL/uL — ABNORMAL LOW (ref 4.22–5.81)
RDW: 13 % (ref 11.5–15.5)
WBC: 10.1 10*3/uL (ref 4.0–10.5)

## 2018-05-25 LAB — BASIC METABOLIC PANEL
Anion gap: 8 (ref 5–15)
BUN: 15 mg/dL (ref 8–23)
CALCIUM: 8.6 mg/dL — AB (ref 8.9–10.3)
CHLORIDE: 98 mmol/L (ref 98–111)
CO2: 28 mmol/L (ref 22–32)
Creatinine, Ser: 1.17 mg/dL (ref 0.61–1.24)
GFR calc Af Amer: >60 mL/min (ref >60)
GFR calc non Af Amer: >60 mL/min (ref >60)
GLUCOSE: 113 mg/dL — AB (ref 70–99)
Potassium: 4.9 mmol/L (ref 3.5–5.1)
Sodium: 134 mmol/L — ABNORMAL LOW (ref 135–145)

## 2018-05-25 MED ORDER — METOPROLOL TARTRATE 25 MG PO TABS
25.0000 mg | ORAL_TABLET | Freq: Two times a day (BID) | ORAL | Status: DC
  Administered 2018-05-25: 25 mg via ORAL
  Filled 2018-05-25: qty 1

## 2018-05-25 MED ORDER — METOPROLOL TARTRATE 12.5 MG HALF TABLET
12.5000 mg | ORAL_TABLET | Freq: Two times a day (BID) | ORAL | Status: DC
  Administered 2018-05-25 – 2018-05-26 (×2): 12.5 mg via ORAL
  Filled 2018-05-25 (×2): qty 1

## 2018-05-25 MED ORDER — METOPROLOL TARTRATE 12.5 MG HALF TABLET: 12.5000 mg | ORAL_TABLET | Freq: Two times a day (BID) | ORAL | Status: DC

## 2018-05-25 MED ORDER — ACETAMINOPHEN 325 MG PO TABS
650.0000 mg | ORAL_TABLET | Freq: Four times a day (QID) | ORAL | Status: DC | PRN
  Administered 2018-05-25: 650 mg via ORAL
  Filled 2018-05-25: qty 2

## 2018-05-25 NOTE — Progress Notes (Signed)
      [address]       [place] [address]             [phone]       Paged this afternoon regarding temp of 101.1 and symptomatic hypotension. I will order labs and cultures. I decreased his Metoprolol to 12.5mg  BID. Monitor BP closely. Incisions look okay without any signs of infection however, the patient does feel like his chest tube site  Incisions are a little more sore today. Dr. Roxy Manns made aware.   Nicholes Rough, PA-C

## 2018-05-25 NOTE — Progress Notes (Signed)
Offered to walk with pt this am. Pt stated he had walked twice this am and was feeling tired after last nights events. Pt and wife had no questions re. Education. Came back to offer to walk with pt this afternoon and pt is having a fever and hypotension. Will continue to follow.  Rufina Falco, RN BSN 05/25/2018 2:19 PM

## 2018-05-25 NOTE — Care Management Note (Signed)
Case Management Note Marvetta Gibbons RN, BSN Unit 4E-Case Manager [phone]  Patient Details  Name: Devon Novak MRN: [id number] Date of Birth: June 04, 1946  Subjective/Objective:     Pt admitted s/p mini AVR               Action/Plan: PTA pt lived at home, independent, anticipate return home, CM to follow for transition of care needs  Expected Discharge Date:  05/25/18               Expected Discharge Plan:  Home/Self Care  In-House Referral:  NA  Discharge planning Services  CM Consult  Post Acute Care Choice:    Choice offered to:     DME Arranged:    DME Agency:     HH Arranged:    HH Agency:     Status of Service:  In process, will continue to follow  If discussed at Long Length of Stay Meetings, dates discussed:    Additional Comments:  Dawayne Patricia, RN 05/25/2018, 3:42 PM

## 2018-05-25 NOTE — Care Management Important Message (Signed)
Important Message  Patient Details  Name: Devon Novak MRN: [id number] Date of Birth: October 12, 1946   Medicare Important Message Given:  Yes    Lemon Whitacre P Jesselle Laflamme 05/25/2018, 3:08 PM

## 2018-05-25 NOTE — Progress Notes (Addendum)
      [address]       ,[place] [address]             [phone]      7 Days Post-Op Procedure(s) (LRB): MINIMALLY INVASIVE AORTIC VALVE REPLACEMENT (AVR) (N/A) TRANSESOPHAGEAL ECHOCARDIOGRAM (TEE) (N/A) Subjective: Feels okay this morning. Went into afib with RVR around 3am.  Objective: Vital signs in last 24 hours: Pulse Rate:  [83-148] 148 (07/02 0256) Cardiac Rhythm: Normal sinus rhythm;Bundle branch block (07/02 0707) Resp:  [16-20] 16 (07/02 0256) BP: (110-136)/(63-81) 124/81 (07/02 0256) SpO2:  [98 %] 98 % (07/02 0256) Weight:  [98.7 kg (217 lb 8 oz)] 98.7 kg (217 lb 8 oz) (07/02 0315)     Intake/Output from previous day: 07/01 0701 - 07/02 0700 In: 118 [P.O.:118] Out: 1250 [Urine:1250] Intake/Output this shift: No intake/output data recorded.  General appearance: alert, cooperative and no distress Heart: regular rate and rhythm, S1, S2 normal, no murmur, click, rub or gallop Lungs: clear to auscultation bilaterally Abdomen: soft, non-tender; bowel sounds normal; no masses,  no organomegaly Extremities: extremities normal, atraumatic, no cyanosis or edema Wound: clean and dry  Lab Results: No results for input(s): WBC, HGB, HCT, PLT in the last 72 hours. BMET: No results for input(s): NA, K, CL, CO2, GLUCOSE, BUN, CREATININE, CALCIUM in the last 72 hours.  PT/INR: No results for input(s): LABPROT, INR in the last 72 hours. ABG    Component Value Date/Time   PHART 7.357 05/18/2018 2248   HCO3 22.1 05/18/2018 2248   TCO2 25 05/19/2018 1719   ACIDBASEDEF 3.0 (H) 05/18/2018 2248   O2SAT 98.0 05/18/2018 2248   CBG (last 3)  No results for input(s): GLUCAP in the last 72 hours.  Assessment/Plan: S/P Procedure(s) (LRB): MINIMALLY INVASIVE AORTIC VALVE REPLACEMENT (AVR) (N/A) TRANSESOPHAGEAL ECHOCARDIOGRAM (TEE) (N/A)   1. CV-NSR in the 90s, stable LBBB. History of A. Fib this morning. Patient converted after Metoprolol 5mg  IV push was  administered.  BP well controlled. Continue ASA and statin.  2. Pulm-All chest tubes removed. CXR this morning showed no increased in tiny right apical pneumothorax. Stable density in the former track of the chest tube.  3. Renal-creatinine 1.08, electrolytes okay. Diuretics discontinued due to euvolemic. 4. H and H is stable. Platelets trending up 5. Endo-blood glucose well controlled on current regimen.  Plan: monitor rhythm closely. Increase Metoprolol to 25mg  BID. Possibly home later today vs. tomorrow if rhythm remains stable.    LOS: 7 days    [name] 05/25/2018  I have seen and examined the patient and agree with the assessment and plan as outlined.  Will try metoprolol - possible d/c home tomorrow if rhythm stable.  Rexene Alberts, MD 05/25/2018 10:10 AM

## 2018-05-25 NOTE — Progress Notes (Signed)
Patient heart rate  at 140s to 160s,Rhythm is atrial fibrillation RVR as confirmed by 12 leads EKG,B/P-136/75, patient is alert and oriented with complaint of mild SOB,O2 applied at 2LPM/Hagan saturation at 95%.Metoprolol 5 mg IV push given. Patient converted back to sinus rhythm confirmed with 12 leads EKG Dr.Owen made aware with order to start amiodarone drip if it will happen again.Will continue to monitor.

## 2018-05-26 LAB — URINE CULTURE: CULTURE: NO GROWTH

## 2018-05-26 MED ORDER — OXYCODONE HCL 5 MG PO TABS: 5.0000 mg | ORAL_TABLET | Freq: Four times a day (QID) | ORAL | 0 refills | Status: DC | PRN

## 2018-05-26 MED ORDER — METOPROLOL TARTRATE 25 MG PO TABS: 12.5000 mg | ORAL_TABLET | Freq: Two times a day (BID) | ORAL | 1 refills | Status: DC

## 2018-05-26 MED ORDER — ACETAMINOPHEN 325 MG PO TABS: 650.0000 mg | ORAL_TABLET | Freq: Four times a day (QID) | ORAL | Status: AC | PRN

## 2018-05-26 MED ORDER — ASPIRIN 325 MG PO TBEC: 325.0000 mg | DELAYED_RELEASE_TABLET | Freq: Every day | ORAL | 0 refills | Status: DC

## 2018-05-26 NOTE — Progress Notes (Signed)
Orders received to discharge patient.  Telemetry monitor removed and CCMD notified.  PIV access removed.  Discharge instructions, follow up, medications and instructions for their use discussed with patient. 

## 2018-05-26 NOTE — Consult Note (Signed)
            [facility] CM Primary Care Navigator  05/26/2018  Devon Novak 1946-05-07 [id number]   Wentto see patient in the room to identify possible discharge needs but he was alreadydischargedhomeper staff.  PerMD note,patientwas admitted for severe aortic stenosis and anemia, underwent aortic valve replacement with bioprosthetic valve.  Primary care provider's office is listed as providing transition of care (TOC) follow-up  Patient hasdischarge instruction to follow-up withprimary care providerin 1 day, cardiology follow-up on 06/14/18 and cardiothoracic surgery on 06/21/18.   For additional questions please contact:  Edwena Felty A. Jane Birkel, BSN, RN-BC [facility] PRIMARY CARE Navigator Cell: [phone]

## 2018-05-26 NOTE — Progress Notes (Addendum)
Pt walking independently. No CR needs. Will sign off. Yves Dill CES, ACSM 9:55 AM 05/26/2018

## 2018-05-26 NOTE — Care Management Note (Signed)
Case Management Note Marvetta Gibbons RN, BSN Unit 4E-Case Manager [phone]  Patient Details  Name: Devon Novak MRN: [id number] Date of Birth: 07-06-1946  Subjective/Objective:     Pt admitted s/p mini AVR               Action/Plan: PTA pt lived at home, independent, anticipate return home, CM to follow for transition of care needs  Expected Discharge Date:  05/26/18               Expected Discharge Plan:  Home/Self Care  In-House Referral:  NA  Discharge planning Services  CM Consult  Post Acute Care Choice:    Choice offered to:     DME Arranged:    DME Agency:     HH Arranged:    [place] Agency:     Status of Service:  Completed, signed off  If discussed at [place] of Stay Meetings, dates discussed:   Discharge Disposition: home/self care    Additional Comments:  05/26/18- 1400- Marvetta Gibbons RN, CM- pt for discharge home today, no CM needs noted for transition home.   Dawayne Patricia, RN 05/26/2018, 2:03 PM

## 2018-05-26 NOTE — Progress Notes (Signed)
Chest tube sutures removed per MD orders without difficulty.  Will continue to monitor. 

## 2018-05-26 NOTE — Progress Notes (Signed)
      [address]       [place] [address]             [phone]      8 Days Post-Op Procedure(s) (LRB): MINIMALLY INVASIVE AORTIC VALVE REPLACEMENT (AVR) (N/A) TRANSESOPHAGEAL ECHOCARDIOGRAM (TEE) (N/A) Subjective: Feels okay this morning. He did have some dizziness when he got up this morning.   Objective: Vital signs in last 24 hours: Temp:  [98.5 F (36.9 C)-101.1 F (38.4 C)] 98.6 F (37 C) (07/03 0747) Pulse Rate:  [62-90] 62 (07/03 0747) Cardiac Rhythm: Normal sinus rhythm (07/02 2055) Resp:  [15-28] 20 (07/03 0747) BP: (100-125)/(62-79) 125/65 (07/03 0747) SpO2:  [93 %-99 %] 95 % (07/03 0747) Weight:  [97.5 kg (214 lb 15.2 oz)] 97.5 kg (214 lb 15.2 oz) (07/03 0600)     Intake/Output from previous day: 07/02 0701 - 07/03 0700 In: -  Out: 1075 [Urine:1075] Intake/Output this shift: Total I/O In: -  Out: 300 [Urine:300]  General appearance: alert, cooperative and no distress Heart: regular rate and rhythm, S1, S2 normal, no murmur, click, rub or gallop Lungs: clear to auscultation bilaterally Abdomen: soft, non-tender; bowel sounds normal; no masses,  no organomegaly Extremities: extremities normal, atraumatic, no cyanosis or edema Wound: clean and dry  Lab Results: Recent Labs    05/25/18 1350  WBC 10.1  HGB 10.9*  HCT 32.8*  PLT 319   BMET:  Recent Labs    05/25/18 1350  NA 134*  K 4.9  CL 98  CO2 28  GLUCOSE 113*  BUN 15  CREATININE 1.17  CALCIUM 8.6*    PT/INR: No results for input(s): LABPROT, INR in the last 72 hours. ABG    Component Value Date/Time   PHART 7.357 05/18/2018 2248   HCO3 22.1 05/18/2018 2248   TCO2 25 05/19/2018 1719   ACIDBASEDEF 3.0 (H) 05/18/2018 2248   O2SAT 98.0 05/18/2018 2248   CBG (last 3)  No results for input(s): GLUCAP in the last 72 hours.  Assessment/Plan: S/P Procedure(s) (LRB): MINIMALLY INVASIVE AORTIC VALVE REPLACEMENT (AVR) (N/A) TRANSESOPHAGEAL ECHOCARDIOGRAM (TEE)  (N/A)  1. CV-NSR in the 90s, stable LBBB. History of A. Fib with RVR. Patient converted after Metoprolol 5mg  IV push was administered.  BP well controlled. Continue ASA and statin.  2. Pulm-All chest tubes removed. CXR showed no increased in tiny right apical pneumothorax. Stable density in the former track of the chest tube.  3. Renal-creatinine 1.17, electrolytes okay. Diuretics discontinued due to euvolemic. 4. H and H is stable. Platelets trending up 5. Endo-blood glucose well controlled on current regimen. 6. ID-fever tmax 101.1 yesterday. Pancultures ordered and pending. Labs okay. WBC within normal range. No further fevers.   Plan: No further fevers. Patient would like to re-evaluate this afternoon if he feels comfortable going home. He plans to walk a few more times before discharge. Will follow cultures.      LOS: 8 days    [name] 05/26/2018

## 2018-05-28 DIAGNOSIS — Z952 Presence of prosthetic heart valve: Secondary | ICD-10-CM | POA: Diagnosis not present

## 2018-05-28 DIAGNOSIS — D500 Iron deficiency anemia secondary to blood loss (chronic): Secondary | ICD-10-CM | POA: Diagnosis not present

## 2018-05-30 LAB — CULTURE, BLOOD (ROUTINE X 2)
CULTURE: NO GROWTH
Culture: NO GROWTH
Special Requests: ADEQUATE

## 2018-05-31 ENCOUNTER — Telehealth (HOSPITAL_COMMUNITY): Payer: Self-pay

## 2018-05-31 ENCOUNTER — Telehealth: Payer: Self-pay

## 2018-05-31 DIAGNOSIS — E668 Other obesity: Secondary | ICD-10-CM | POA: Diagnosis not present

## 2018-05-31 DIAGNOSIS — R509 Fever, unspecified: Secondary | ICD-10-CM | POA: Diagnosis not present

## 2018-05-31 DIAGNOSIS — Z952 Presence of prosthetic heart valve: Secondary | ICD-10-CM | POA: Diagnosis not present

## 2018-05-31 DIAGNOSIS — E785 Hyperlipidemia, unspecified: Secondary | ICD-10-CM | POA: Diagnosis not present

## 2018-05-31 DIAGNOSIS — I359 Nonrheumatic aortic valve disorder, unspecified: Secondary | ICD-10-CM | POA: Diagnosis not present

## 2018-05-31 DIAGNOSIS — I350 Nonrheumatic aortic (valve) stenosis: Secondary | ICD-10-CM | POA: Diagnosis not present

## 2018-05-31 DIAGNOSIS — R002 Palpitations: Secondary | ICD-10-CM | POA: Diagnosis not present

## 2018-05-31 DIAGNOSIS — G4733 Obstructive sleep apnea (adult) (pediatric): Secondary | ICD-10-CM | POA: Diagnosis not present

## 2018-05-31 NOTE — Telephone Encounter (Signed)
Patients insurance is active and benefits verified through Medicare A/B - No co-pay, deductible amount of $185.00/$185.00 has been met, no out of pocket, 20% co-insurance, and no pre-authorization is required. Passport/reference [phone]  Will contact patient to see if he is interested in the Cardiac Rehab Program. If interested, patient will need to complete follow up appt. Once completed, patient will be contacted for scheduling upon review by the RN Navigator.

## 2018-05-31 NOTE — Telephone Encounter (Signed)
Assessment Questions:  When was your surgery date? 05/18/2018   When were you discharged from the hospital? 05/26/2018   Who is your Cardiologist? Dr. Wynonia Lawman  Have you seen him/ her? Not yet, appointment on 06/14/18   Who is your PCP/ Family Medicine Physician? Dr. Rex Kras  Have you seen him/ her? Not yet   1. Symptoms- Arrhythmias   2. Onset of Symptoms-it comes and goes  3. Location-n/a   4. Severity (how bad is it?)- n/a  5. Are there any signs or symptoms of infection? No, my incisions look great.  a. Redness b. Warm to touch c. Red streaks d. Swelling e. Drainage/ Puss f. Fowl odor/ smell g. Do you have a temperature? 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  No mention on fevers when called.   6.  Pain (scale 0- 10)-   7.  Other Symptoms?              Any Chest Pain/ Tightness/ Heaviness-  NO   If so, is it severe?    If yes, please hang up and dial 911, or go to the nearest Emergency Department.   Shortness of Breath- NO If so, is it severe?  If yes, please hang up and dial 911, or go to the nearest Emergency Department.  Lower Extremity Edema/ Swelling- NO  If so, are you on a Diuretic (fluid pill)?  Have you been keeping your legs elevated as much as possible when seated?  I advised Devon Novak to give his Cardiologist, Dr. Wynonia Lawman a call and see if he could get an earlier appointment to see him.  I also told him to call us back if he has any problems.  Patient acknowledged receipt.

## 2018-05-31 NOTE — Telephone Encounter (Signed)
Called patient to see if he is interested in the Cardiac Rehab program - patient stated he is interested. Explained scheduling process and went over insurance, patient verbalized understanding. Will contact patient for scheduling upon review by the RN Navigator once follow up appt has been completed.

## 2018-06-01 DIAGNOSIS — G4733 Obstructive sleep apnea (adult) (pediatric): Secondary | ICD-10-CM | POA: Diagnosis not present

## 2018-06-01 DIAGNOSIS — I359 Nonrheumatic aortic valve disorder, unspecified: Secondary | ICD-10-CM | POA: Diagnosis not present

## 2018-06-01 DIAGNOSIS — R509 Fever, unspecified: Secondary | ICD-10-CM | POA: Diagnosis not present

## 2018-06-01 DIAGNOSIS — Z952 Presence of prosthetic heart valve: Secondary | ICD-10-CM | POA: Diagnosis not present

## 2018-06-01 DIAGNOSIS — R002 Palpitations: Secondary | ICD-10-CM | POA: Diagnosis not present

## 2018-06-01 DIAGNOSIS — E785 Hyperlipidemia, unspecified: Secondary | ICD-10-CM | POA: Diagnosis not present

## 2018-06-01 DIAGNOSIS — E668 Other obesity: Secondary | ICD-10-CM | POA: Diagnosis not present

## 2018-06-01 DIAGNOSIS — I350 Nonrheumatic aortic (valve) stenosis: Secondary | ICD-10-CM | POA: Diagnosis not present

## 2018-06-07 DIAGNOSIS — E785 Hyperlipidemia, unspecified: Secondary | ICD-10-CM | POA: Diagnosis not present

## 2018-06-07 DIAGNOSIS — Z952 Presence of prosthetic heart valve: Secondary | ICD-10-CM | POA: Diagnosis not present

## 2018-06-07 DIAGNOSIS — R509 Fever, unspecified: Secondary | ICD-10-CM | POA: Diagnosis not present

## 2018-06-07 DIAGNOSIS — I350 Nonrheumatic aortic (valve) stenosis: Secondary | ICD-10-CM | POA: Diagnosis not present

## 2018-06-07 DIAGNOSIS — R002 Palpitations: Secondary | ICD-10-CM | POA: Diagnosis not present

## 2018-06-07 DIAGNOSIS — E668 Other obesity: Secondary | ICD-10-CM | POA: Diagnosis not present

## 2018-06-07 DIAGNOSIS — G4733 Obstructive sleep apnea (adult) (pediatric): Secondary | ICD-10-CM | POA: Diagnosis not present

## 2018-06-07 DIAGNOSIS — I359 Nonrheumatic aortic valve disorder, unspecified: Secondary | ICD-10-CM | POA: Diagnosis not present

## 2018-06-14 ENCOUNTER — Encounter: Payer: Self-pay | Admitting: Physician Assistant

## 2018-06-14 ENCOUNTER — Ambulatory Visit (INDEPENDENT_AMBULATORY_CARE_PROVIDER_SITE_OTHER): Payer: Medicare Other | Admitting: Physician Assistant

## 2018-06-14 ENCOUNTER — Ambulatory Visit: Payer: Medicare Other | Admitting: Thoracic Surgery (Cardiothoracic Vascular Surgery)

## 2018-06-14 VITALS — BP 132/70 | HR 67 | Ht 69.0 in | Wt 215.0 lb

## 2018-06-14 DIAGNOSIS — Z953 Presence of xenogenic heart valve: Secondary | ICD-10-CM

## 2018-06-14 NOTE — Patient Instructions (Signed)
Medication Instructions:  1. Your physician recommends that you continue on your current medications as directed. Please refer to the Current Medication list given to you today.   Labwork: NONE ORDERED TODAY  Testing/Procedures: NONE ORDERED TODAY  Follow-Up: PLEASE FOLLOW UP WITH DR. TILLEY AS PLANNED  Any Other Special Instructions Will Be Listed Below (If Applicable).     If you need a refill on your cardiac medications before your next appointment, please call your pharmacy.
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
## 2018-06-17 ENCOUNTER — Telehealth (HOSPITAL_COMMUNITY): Payer: Self-pay

## 2018-06-17 NOTE — Telephone Encounter (Signed)
Attempted to contact patient in regards to Cardiac Rehab - lm on vm °

## 2018-06-18 ENCOUNTER — Other Ambulatory Visit: Payer: Self-pay | Admitting: Thoracic Surgery (Cardiothoracic Vascular Surgery)

## 2018-06-18 DIAGNOSIS — Z953 Presence of xenogenic heart valve: Secondary | ICD-10-CM

## 2018-06-21 ENCOUNTER — Other Ambulatory Visit: Payer: Self-pay

## 2018-06-21 ENCOUNTER — Ambulatory Visit (INDEPENDENT_AMBULATORY_CARE_PROVIDER_SITE_OTHER): Payer: Medicare Other | Admitting: Surgical

## 2018-06-21 ENCOUNTER — Ambulatory Visit
Admission: RE | Admit: 2018-06-21 | Discharge: 2018-06-21 | Disposition: A | Discharge status: Home / Self Care | Payer: Medicare Other | Source: Ambulatory Visit | Attending: Thoracic Surgery (Cardiothoracic Vascular Surgery) | Admitting: Thoracic Surgery (Cardiothoracic Vascular Surgery)

## 2018-06-21 ENCOUNTER — Telehealth (HOSPITAL_COMMUNITY): Payer: Self-pay

## 2018-06-21 VITALS — BP 130/77 | HR 74 | Resp 18 | Ht 69.0 in | Wt 218.8 lb

## 2018-06-21 DIAGNOSIS — R918 Other nonspecific abnormal finding of lung field: Secondary | ICD-10-CM | POA: Diagnosis not present

## 2018-06-21 DIAGNOSIS — Z953 Presence of xenogenic heart valve: Secondary | ICD-10-CM

## 2018-06-21 DIAGNOSIS — I359 Nonrheumatic aortic valve disorder, unspecified: Secondary | ICD-10-CM | POA: Diagnosis not present

## 2018-06-21 NOTE — Telephone Encounter (Signed)
Patient called to schedule for Cardiac Rehab, adv pt we are scheduling out until Sep. Patient stated that is to long of a time to wait to start rehab.  Closed referral
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
## 2018-06-21 NOTE — Patient Instructions (Signed)
Discussed activity progression including driving. °

## 2018-06-22 ENCOUNTER — Other Ambulatory Visit: Payer: Self-pay | Admitting: Physician Assistant

## 2018-06-23 ENCOUNTER — Encounter: Payer: Self-pay | Admitting: Thoracic Surgery (Cardiothoracic Vascular Surgery)

## 2018-07-05 ENCOUNTER — Telehealth: Payer: Self-pay | Admitting: Cardiovascular Disease

## 2018-07-05 NOTE — Telephone Encounter (Signed)
New Message:    Delana Meyer calling to find out the status of the pt's Cardiac Rehab referral that was sent on 06-29-18 and 07-02-18. Please fax this back asap to [phone].

## 2018-07-05 NOTE — Telephone Encounter (Signed)
Dr. Burt Knack signed paperwork.  Faxed to [phone].

## 2018-07-06 DIAGNOSIS — Z952 Presence of prosthetic heart valve: Secondary | ICD-10-CM | POA: Diagnosis not present

## 2018-07-07 DIAGNOSIS — Z952 Presence of prosthetic heart valve: Secondary | ICD-10-CM | POA: Diagnosis not present

## 2018-07-08 DIAGNOSIS — Z952 Presence of prosthetic heart valve: Secondary | ICD-10-CM | POA: Diagnosis not present

## 2018-07-12 DIAGNOSIS — Z952 Presence of prosthetic heart valve: Secondary | ICD-10-CM | POA: Diagnosis not present

## 2018-07-14 ENCOUNTER — Emergency Department (HOSPITAL_COMMUNITY)
Admission: EM | Admit: 2018-07-14 | Discharge: 2018-07-14 | Disposition: A | Discharge status: Home / Self Care | Payer: Medicare Other | Attending: Emergency Medicine | Admitting: Emergency Medicine

## 2018-07-14 ENCOUNTER — Other Ambulatory Visit: Payer: Self-pay

## 2018-07-14 DIAGNOSIS — Z7982 Long term (current) use of aspirin: Secondary | ICD-10-CM | POA: Diagnosis not present

## 2018-07-14 DIAGNOSIS — R42 Dizziness and giddiness: Secondary | ICD-10-CM | POA: Diagnosis not present

## 2018-07-14 DIAGNOSIS — Z87891 Personal history of nicotine dependence: Secondary | ICD-10-CM | POA: Insufficient documentation

## 2018-07-14 DIAGNOSIS — R55 Syncope and collapse: Secondary | ICD-10-CM | POA: Insufficient documentation

## 2018-07-14 DIAGNOSIS — A6920 Lyme disease, unspecified: Secondary | ICD-10-CM | POA: Diagnosis not present

## 2018-07-14 DIAGNOSIS — I959 Hypotension, unspecified: Secondary | ICD-10-CM | POA: Diagnosis not present

## 2018-07-14 DIAGNOSIS — Z79899 Other long term (current) drug therapy: Secondary | ICD-10-CM | POA: Diagnosis not present

## 2018-07-14 DIAGNOSIS — I499 Cardiac arrhythmia, unspecified: Secondary | ICD-10-CM | POA: Diagnosis not present

## 2018-07-14 DIAGNOSIS — Z952 Presence of prosthetic heart valve: Secondary | ICD-10-CM | POA: Insufficient documentation

## 2018-07-14 DIAGNOSIS — J45909 Unspecified asthma, uncomplicated: Secondary | ICD-10-CM | POA: Insufficient documentation

## 2018-07-14 DIAGNOSIS — R0902 Hypoxemia: Secondary | ICD-10-CM | POA: Diagnosis not present

## 2018-07-14 NOTE — ED Triage Notes (Signed)
Patient was celebrating wife's birthday in hot tub after valve replacement 6 weeks ago. Patient had 2 glass of champagne and took Viagra. Patient became unconscious in hot tub. No water intake reported.

## 2018-07-14 NOTE — ED Notes (Signed)
Pt ambulated in hall with no complaints.
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
## 2018-07-14 NOTE — Discharge Instructions (Signed)
Call your cardiologist in the morning and discuss you visit here.  See if they want to see you sooner in the office.

## 2018-07-15 DIAGNOSIS — Z952 Presence of prosthetic heart valve: Secondary | ICD-10-CM | POA: Diagnosis not present

## 2018-07-19 DIAGNOSIS — R55 Syncope and collapse: Secondary | ICD-10-CM | POA: Diagnosis not present

## 2018-07-19 DIAGNOSIS — Z952 Presence of prosthetic heart valve: Secondary | ICD-10-CM | POA: Diagnosis not present

## 2018-07-21 DIAGNOSIS — Z952 Presence of prosthetic heart valve: Secondary | ICD-10-CM | POA: Diagnosis not present

## 2018-07-22 DIAGNOSIS — Z952 Presence of prosthetic heart valve: Secondary | ICD-10-CM | POA: Diagnosis not present

## 2018-08-04 DIAGNOSIS — Z952 Presence of prosthetic heart valve: Secondary | ICD-10-CM | POA: Diagnosis not present

## 2018-08-05 DIAGNOSIS — Z952 Presence of prosthetic heart valve: Secondary | ICD-10-CM | POA: Diagnosis not present

## 2018-08-09 DIAGNOSIS — Z952 Presence of prosthetic heart valve: Secondary | ICD-10-CM | POA: Diagnosis not present

## 2018-08-10 DIAGNOSIS — E785 Hyperlipidemia, unspecified: Secondary | ICD-10-CM | POA: Diagnosis not present

## 2018-08-10 DIAGNOSIS — E668 Other obesity: Secondary | ICD-10-CM | POA: Diagnosis not present

## 2018-08-10 DIAGNOSIS — I119 Hypertensive heart disease without heart failure: Secondary | ICD-10-CM | POA: Diagnosis not present

## 2018-08-10 DIAGNOSIS — Z952 Presence of prosthetic heart valve: Secondary | ICD-10-CM | POA: Diagnosis not present

## 2018-08-10 DIAGNOSIS — G4733 Obstructive sleep apnea (adult) (pediatric): Secondary | ICD-10-CM | POA: Diagnosis not present

## 2018-08-10 DIAGNOSIS — I493 Ventricular premature depolarization: Secondary | ICD-10-CM | POA: Diagnosis not present

## 2018-08-11 DIAGNOSIS — Z952 Presence of prosthetic heart valve: Secondary | ICD-10-CM | POA: Diagnosis not present

## 2018-08-12 DIAGNOSIS — Z952 Presence of prosthetic heart valve: Secondary | ICD-10-CM | POA: Diagnosis not present

## 2018-08-13 ENCOUNTER — Other Ambulatory Visit: Payer: Self-pay | Admitting: Thoracic Surgery (Cardiothoracic Vascular Surgery)

## 2018-08-13 DIAGNOSIS — Z953 Presence of xenogenic heart valve: Secondary | ICD-10-CM

## 2018-08-16 ENCOUNTER — Encounter: Payer: Self-pay | Admitting: Thoracic Surgery (Cardiothoracic Vascular Surgery)

## 2018-08-16 ENCOUNTER — Ambulatory Visit
Admission: RE | Admit: 2018-08-16 | Discharge: 2018-08-16 | Disposition: A | Discharge status: Home / Self Care | Payer: Medicare Other | Source: Ambulatory Visit | Attending: Thoracic Surgery (Cardiothoracic Vascular Surgery) | Admitting: Thoracic Surgery (Cardiothoracic Vascular Surgery)

## 2018-08-16 ENCOUNTER — Ambulatory Visit (INDEPENDENT_AMBULATORY_CARE_PROVIDER_SITE_OTHER): Payer: Self-pay | Admitting: Thoracic Surgery (Cardiothoracic Vascular Surgery)

## 2018-08-16 ENCOUNTER — Other Ambulatory Visit: Payer: Self-pay

## 2018-08-16 VITALS — BP 126/72 | HR 95 | Resp 18 | Ht 69.0 in | Wt 226.4 lb

## 2018-08-16 DIAGNOSIS — Z952 Presence of prosthetic heart valve: Secondary | ICD-10-CM | POA: Diagnosis not present

## 2018-08-16 DIAGNOSIS — E785 Hyperlipidemia, unspecified: Secondary | ICD-10-CM | POA: Diagnosis present

## 2018-08-16 DIAGNOSIS — R918 Other nonspecific abnormal finding of lung field: Secondary | ICD-10-CM | POA: Diagnosis not present

## 2018-08-16 DIAGNOSIS — Z953 Presence of xenogenic heart valve: Secondary | ICD-10-CM

## 2018-08-16 DIAGNOSIS — G4733 Obstructive sleep apnea (adult) (pediatric): Secondary | ICD-10-CM

## 2018-08-16 HISTORY — DX: Obstructive sleep apnea (adult) (pediatric): G47.33
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
## 2018-08-16 NOTE — Patient Instructions (Signed)

## 2018-08-17 ENCOUNTER — Encounter: Payer: Self-pay | Admitting: Cardiology

## 2018-08-18 DIAGNOSIS — Z952 Presence of prosthetic heart valve: Secondary | ICD-10-CM | POA: Diagnosis not present

## 2018-08-19 DIAGNOSIS — H35372 Puckering of macula, left eye: Secondary | ICD-10-CM | POA: Diagnosis not present

## 2018-08-19 DIAGNOSIS — H3554 Dystrophies primarily involving the retinal pigment epithelium: Secondary | ICD-10-CM | POA: Diagnosis not present

## 2018-08-19 DIAGNOSIS — H35369 Drusen (degenerative) of macula, unspecified eye: Secondary | ICD-10-CM | POA: Diagnosis not present

## 2018-08-19 DIAGNOSIS — Z952 Presence of prosthetic heart valve: Secondary | ICD-10-CM | POA: Diagnosis not present

## 2018-09-01 DIAGNOSIS — Z952 Presence of prosthetic heart valve: Secondary | ICD-10-CM | POA: Diagnosis not present

## 2018-09-06 DIAGNOSIS — Z952 Presence of prosthetic heart valve: Secondary | ICD-10-CM | POA: Diagnosis not present

## 2018-09-08 DIAGNOSIS — Z952 Presence of prosthetic heart valve: Secondary | ICD-10-CM | POA: Diagnosis not present

## 2018-09-09 DIAGNOSIS — Z952 Presence of prosthetic heart valve: Secondary | ICD-10-CM | POA: Diagnosis not present

## 2018-09-20 DIAGNOSIS — Z952 Presence of prosthetic heart valve: Secondary | ICD-10-CM | POA: Diagnosis not present

## 2018-09-22 DIAGNOSIS — Z952 Presence of prosthetic heart valve: Secondary | ICD-10-CM | POA: Diagnosis not present

## 2018-09-23 DIAGNOSIS — Z952 Presence of prosthetic heart valve: Secondary | ICD-10-CM | POA: Diagnosis not present

## 2018-09-27 DIAGNOSIS — Z952 Presence of prosthetic heart valve: Secondary | ICD-10-CM | POA: Diagnosis not present

## 2018-09-29 DIAGNOSIS — Z952 Presence of prosthetic heart valve: Secondary | ICD-10-CM | POA: Diagnosis not present

## 2018-09-30 DIAGNOSIS — Z952 Presence of prosthetic heart valve: Secondary | ICD-10-CM | POA: Diagnosis not present

## 2018-10-04 DIAGNOSIS — Z952 Presence of prosthetic heart valve: Secondary | ICD-10-CM | POA: Diagnosis not present

## 2018-10-06 DIAGNOSIS — Z952 Presence of prosthetic heart valve: Secondary | ICD-10-CM | POA: Diagnosis not present

## 2018-10-07 DIAGNOSIS — Z952 Presence of prosthetic heart valve: Secondary | ICD-10-CM | POA: Diagnosis not present

## 2018-10-11 DIAGNOSIS — Z952 Presence of prosthetic heart valve: Secondary | ICD-10-CM | POA: Diagnosis not present
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
## 2018-10-12 ENCOUNTER — Ambulatory Visit (INDEPENDENT_AMBULATORY_CARE_PROVIDER_SITE_OTHER): Payer: Medicare Other | Admitting: Cardiology

## 2018-10-12 ENCOUNTER — Encounter: Payer: Self-pay | Admitting: Cardiology

## 2018-10-12 VITALS — BP 140/84 | HR 63 | Ht 69.0 in | Wt 230.0 lb

## 2018-10-12 DIAGNOSIS — I10 Essential (primary) hypertension: Secondary | ICD-10-CM

## 2018-10-12 DIAGNOSIS — I493 Ventricular premature depolarization: Secondary | ICD-10-CM

## 2018-10-12 DIAGNOSIS — E785 Hyperlipidemia, unspecified: Secondary | ICD-10-CM | POA: Diagnosis not present

## 2018-10-12 DIAGNOSIS — Z953 Presence of xenogenic heart valve: Secondary | ICD-10-CM | POA: Diagnosis not present

## 2018-10-12 NOTE — Patient Instructions (Addendum)
Medication Instructions:  Your physician recommends that you continue on your current medications as directed. Please refer to the Current Medication list given to you today.  If you need a refill on your cardiac medications before your next appointment, please call your pharmacy.   Lab work: You will have lab work today: Lipids, CBC, and TSH  If you have labs (blood work) drawn today and your tests are completely normal, you will receive your results only by: [name] MyChart Message (if you have MyChart) OR . A paper copy in the mail If you have any lab test that is abnormal or we need to change your treatment, we will call you to review the results.  Testing/Procedures: None    Follow-Up: At [facility], you and your health needs are our priority.  As part of our continuing mission to provide you with exceptional heart care, we have created designated Provider Care Teams.  These Care Teams include your primary Cardiologist (physician) and Advanced Practice Providers (APPs -  Physician Assistants and Nurse Practitioners) who all work together to provide you with the care you need, when you need it. You will need a follow up appointment in 1 years.  Please call our office 2 months in advance to schedule this appointment.

## 2018-10-13 LAB — LIPID PANEL
CHOL/HDL RATIO: 2.3 ratio (ref 0.0–5.0)
Cholesterol, Total: 135 mg/dL (ref 100–199)
HDL: 58 mg/dL (ref >39)
LDL Calculated: 64 mg/dL (ref 0–99)
TRIGLYCERIDES: 66 mg/dL (ref 0–149)
VLDL Cholesterol Cal: 13 mg/dL (ref 5–40)

## 2018-10-13 LAB — CBC
Hematocrit: 45.6 % (ref 37.5–51.0)
Hemoglobin: 15.6 g/dL (ref 13.0–17.7)
MCH: 28.7 pg (ref 26.6–33.0)
MCHC: 34.2 g/dL (ref 31.5–35.7)
MCV: 84 fL (ref 79–97)
Platelets: 252 10*3/uL (ref 150–450)
RBC: 5.43 x10E6/uL (ref 4.14–5.80)
RDW: 15.6 % — AB (ref 12.3–15.4)
WBC: 5.9 10*3/uL (ref 3.4–10.8)

## 2018-10-13 LAB — TSH: TSH: 1.26 u[IU]/mL (ref 0.450–4.500)

## 2018-12-08 DIAGNOSIS — N400 Enlarged prostate without lower urinary tract symptoms: Secondary | ICD-10-CM | POA: Diagnosis not present

## 2018-12-08 DIAGNOSIS — I10 Essential (primary) hypertension: Secondary | ICD-10-CM | POA: Diagnosis not present

## 2018-12-08 DIAGNOSIS — G4730 Sleep apnea, unspecified: Secondary | ICD-10-CM | POA: Diagnosis not present

## 2018-12-08 DIAGNOSIS — Z6835 Body mass index (BMI) 35.0-35.9, adult: Secondary | ICD-10-CM | POA: Diagnosis not present

## 2018-12-08 DIAGNOSIS — Z79899 Other long term (current) drug therapy: Secondary | ICD-10-CM | POA: Diagnosis not present

## 2018-12-08 DIAGNOSIS — Z86010 Personal history of colonic polyps: Secondary | ICD-10-CM | POA: Diagnosis not present

## 2018-12-08 DIAGNOSIS — Z87891 Personal history of nicotine dependence: Secondary | ICD-10-CM | POA: Diagnosis not present

## 2018-12-08 DIAGNOSIS — Z0000 Encounter for general adult medical examination without abnormal findings: Secondary | ICD-10-CM | POA: Diagnosis not present

## 2018-12-08 DIAGNOSIS — Z952 Presence of prosthetic heart valve: Secondary | ICD-10-CM | POA: Diagnosis not present

## 2018-12-08 DIAGNOSIS — Z1389 Encounter for screening for other disorder: Secondary | ICD-10-CM | POA: Diagnosis not present

## 2018-12-08 DIAGNOSIS — F5104 Psychophysiologic insomnia: Secondary | ICD-10-CM | POA: Diagnosis not present

## 2018-12-08 DIAGNOSIS — Z1159 Encounter for screening for other viral diseases: Secondary | ICD-10-CM | POA: Diagnosis not present

## 2018-12-08 DIAGNOSIS — E785 Hyperlipidemia, unspecified: Secondary | ICD-10-CM | POA: Diagnosis not present

## 2018-12-10 ENCOUNTER — Other Ambulatory Visit: Payer: Self-pay | Admitting: Family Medicine

## 2018-12-10 DIAGNOSIS — Z87891 Personal history of nicotine dependence: Secondary | ICD-10-CM

## 2018-12-20 ENCOUNTER — Ambulatory Visit: Payer: Medicare Other

## 2018-12-22 ENCOUNTER — Ambulatory Visit
Admission: RE | Admit: 2018-12-22 | Discharge: 2018-12-22 | Disposition: A | Discharge status: Home / Self Care | Payer: Medicare Other | Source: Ambulatory Visit | Attending: Family Medicine | Admitting: Family Medicine

## 2018-12-22 DIAGNOSIS — Z136 Encounter for screening for cardiovascular disorders: Secondary | ICD-10-CM | POA: Diagnosis not present

## 2018-12-22 DIAGNOSIS — Z87891 Personal history of nicotine dependence: Secondary | ICD-10-CM

## 2019-01-10 DIAGNOSIS — R972 Elevated prostate specific antigen [PSA]: Secondary | ICD-10-CM | POA: Diagnosis not present

## 2019-01-17 DIAGNOSIS — N5201 Erectile dysfunction due to arterial insufficiency: Secondary | ICD-10-CM | POA: Diagnosis not present

## 2019-01-17 DIAGNOSIS — N401 Enlarged prostate with lower urinary tract symptoms: Secondary | ICD-10-CM | POA: Diagnosis not present

## 2019-01-17 DIAGNOSIS — R972 Elevated prostate specific antigen [PSA]: Secondary | ICD-10-CM | POA: Diagnosis not present

## 2019-01-17 DIAGNOSIS — R339 Retention of urine, unspecified: Secondary | ICD-10-CM | POA: Diagnosis not present

## 2019-02-03 DIAGNOSIS — R3911 Hesitancy of micturition: Secondary | ICD-10-CM | POA: Diagnosis not present

## 2019-02-03 DIAGNOSIS — E291 Testicular hypofunction: Secondary | ICD-10-CM | POA: Diagnosis not present

## 2019-02-03 DIAGNOSIS — N529 Male erectile dysfunction, unspecified: Secondary | ICD-10-CM | POA: Diagnosis not present

## 2019-02-03 DIAGNOSIS — Z125 Encounter for screening for malignant neoplasm of prostate: Secondary | ICD-10-CM | POA: Diagnosis not present

## 2019-02-03 DIAGNOSIS — E039 Hypothyroidism, unspecified: Secondary | ICD-10-CM | POA: Diagnosis not present

## 2019-02-21 DIAGNOSIS — R972 Elevated prostate specific antigen [PSA]: Secondary | ICD-10-CM | POA: Diagnosis not present

## 2019-02-21 DIAGNOSIS — R3912 Poor urinary stream: Secondary | ICD-10-CM | POA: Diagnosis not present

## 2019-02-21 DIAGNOSIS — N5201 Erectile dysfunction due to arterial insufficiency: Secondary | ICD-10-CM | POA: Diagnosis not present

## 2019-02-21 DIAGNOSIS — N401 Enlarged prostate with lower urinary tract symptoms: Secondary | ICD-10-CM | POA: Diagnosis not present

## 2019-03-03 ENCOUNTER — Other Ambulatory Visit: Payer: Self-pay | Admitting: *Deleted

## 2019-05-15 DIAGNOSIS — I10 Essential (primary) hypertension: Secondary | ICD-10-CM

## 2019-05-16 MED ORDER — HYDROCHLOROTHIAZIDE 12.5 MG PO CAPS: 12.5000 mg | ORAL_CAPSULE | Freq: Every day | ORAL | 2 refills | Status: AC

## 2019-05-23 ENCOUNTER — Other Ambulatory Visit: Payer: Self-pay | Admitting: *Deleted

## 2019-05-23 ENCOUNTER — Ambulatory Visit (INDEPENDENT_AMBULATORY_CARE_PROVIDER_SITE_OTHER): Payer: Medicare Other | Admitting: Thoracic Surgery (Cardiothoracic Vascular Surgery)

## 2019-05-23 ENCOUNTER — Encounter: Payer: Self-pay | Admitting: Thoracic Surgery (Cardiothoracic Vascular Surgery)

## 2019-05-23 ENCOUNTER — Other Ambulatory Visit: Payer: Self-pay

## 2019-05-23 VITALS — BP 139/88 | HR 69 | Temp 97.9°F | Resp 16 | Ht 69.0 in | Wt 218.0 lb

## 2019-05-23 DIAGNOSIS — Z953 Presence of xenogenic heart valve: Secondary | ICD-10-CM | POA: Diagnosis not present

## 2019-05-23 DIAGNOSIS — I350 Nonrheumatic aortic (valve) stenosis: Secondary | ICD-10-CM | POA: Diagnosis not present

## 2019-05-23 NOTE — Progress Notes (Signed)
[address]       [place] [address]             [facility] OFFICE NOTE  Referring Provider is Sherren Mocha, MD Primary Cardiologist is Shirlee More, MD PCP is [name], MD   HPI:  Patient is a 73 year old male returns the office today for routine follow-up approximately 1 year status post minimally invasive aortic valve replacement using a rapid deployment stented bioprosthetic tissue valve on May 18, 2018 for severe symptomatic aortic stenosis.  His postoperative recovery was uneventful and he was last seen here in our office on August 16, 2018.  He is a former patient of Dr. Wynonia Lawman who performed a routine follow-up echocardiogram in his office that reportedly revealed normal left ventricular systolic function with normal functioning bioprosthetic tissue valve in the aortic position.  More recently the patient has been seen in follow-up by Dr. Bettina Gavia who has taken over the patient's care.  The patient returns her office today and reports that he is doing fine.  He is quite active physically and he only gets short of breath if he really pushes himself physically.  He states that overall he is "much improved" in comparison with how he felt prior to surgery.  He does not get any chest pain or chest tightness either with activity or at rest.  He has no physical limitations to speak of.   Current Outpatient Medications  Medication Sig Dispense Refill  . acetaminophen (TYLENOL) 325 MG tablet Take 2 tablets (650 mg total) by mouth every 6 (six) hours as needed for mild pain or fever.    [name] aspirin EC 81 MG tablet Take 81 mg by mouth daily.    [name] atorvastatin (LIPITOR) 40 MG tablet Take 40 mg by mouth every evening.     . Cholecalciferol 5000 units TABS Take 1 tablet by mouth daily.    . Cyanocobalamin (B-12) 2500 MCG TABS Take 1 tablet by mouth daily.    . famotidine (PEPCID) 20 MG tablet Take 20 mg by mouth daily.    . finasteride  (PROSCAR) 5 MG tablet Take 5 mg by mouth every evening.     . hydrochlorothiazide (MICROZIDE) 12.5 MG capsule Take 1 capsule (12.5 mg total) by mouth daily. 90 capsule 2  . lisinopril (PRINIVIL,ZESTRIL) 10 MG tablet Take 10 mg by mouth daily.     . magnesium oxide (MAG-OX) 400 MG tablet Take 400 mg by mouth daily.    . Multiple Vitamins-Minerals (MULTIVITAMIN ADULT PO) Take 1 tablet by mouth daily.    . Polyvinyl Alcohol (LIQUID TEARS OP) Place 1 drop into both eyes daily as needed (dry eyes).    . Probiotic Product (PROBIOTIC DAILY) CAPS Take 1 capsule by mouth daily.    . Tetrahydrozoline HCl (VISINE OP) Apply 1 drop to eye daily as needed (dry eyes).    [name] zolpidem (AMBIEN) 10 MG tablet Take 1 tablet by mouth daily as needed.     No current facility-administered medications for this visit.       Physical Exam:   BP 139/88 (BP Location: Right Arm, Patient Position: Sitting, Cuff Size: Normal)   Pulse 69   Temp 97.9 F (36.6 C) Comment: thermal  Resp 16   Ht 5\' 9"  (1.753 m)   Wt 218 lb (98.9 kg)   SpO2 96% Comment: RA  BMI 32.19 kg/m   General:  Well-appearing  Chest:   Clear  to auscultation  CV:   Regular rate and rhythm without murmur  Incisions:  Completely healed  Abdomen:  Soft nontender  Extremities:  Warm and well-perfused  Diagnostic Tests:  n/a   Impression:  Patient is doing very well approximately 1 year status post aortic valve replacement using a bioprosthetic tissue valve  Plan:  The patient will continue to follow-up with Dr. Bettina Gavia and return to our office in the future only should specific problems or questions arise.  We have not recommended any changes to his current medications.  The patient has been reminded regarding the importance of dental hygiene and the lifelong need for antibiotic prophylaxis for all dental cleanings and other related invasive procedures.   I spent in excess of 15 minutes during the conduct of this office consultation and >50%  of this time involved direct face-to-face encounter with the patient for counseling and/or coordination of their care.   Valentina Gu. Roxy Manns, MD 05/23/2019 1:41 PM

## 2019-05-23 NOTE — Patient Instructions (Signed)

## 2019-05-27 DIAGNOSIS — S1086XA Insect bite of other specified part of neck, initial encounter: Secondary | ICD-10-CM | POA: Diagnosis not present

## 2019-05-27 DIAGNOSIS — S40261A Insect bite (nonvenomous) of right shoulder, initial encounter: Secondary | ICD-10-CM | POA: Diagnosis not present

## 2019-06-06 DIAGNOSIS — H3554 Dystrophies primarily involving the retinal pigment epithelium: Secondary | ICD-10-CM | POA: Diagnosis not present

## 2019-06-06 DIAGNOSIS — H532 Diplopia: Secondary | ICD-10-CM | POA: Diagnosis not present

## 2019-06-06 DIAGNOSIS — H35372 Puckering of macula, left eye: Secondary | ICD-10-CM | POA: Diagnosis not present

## 2019-07-27 DIAGNOSIS — W19XXXA Unspecified fall, initial encounter: Secondary | ICD-10-CM | POA: Diagnosis not present

## 2019-07-27 DIAGNOSIS — S63501A Unspecified sprain of right wrist, initial encounter: Secondary | ICD-10-CM | POA: Diagnosis not present

## 2019-07-27 DIAGNOSIS — S6991XA Unspecified injury of right wrist, hand and finger(s), initial encounter: Secondary | ICD-10-CM | POA: Diagnosis not present

## 2019-09-13 ENCOUNTER — Ambulatory Visit (INDEPENDENT_AMBULATORY_CARE_PROVIDER_SITE_OTHER): Payer: Medicare Other

## 2019-09-13 ENCOUNTER — Encounter: Payer: Self-pay | Admitting: Cardiology

## 2019-09-13 ENCOUNTER — Ambulatory Visit (INDEPENDENT_AMBULATORY_CARE_PROVIDER_SITE_OTHER): Payer: Medicare Other | Admitting: Cardiology

## 2019-09-13 ENCOUNTER — Other Ambulatory Visit: Payer: Self-pay

## 2019-09-13 VITALS — BP 112/72 | HR 70 | Ht 69.0 in | Wt 226.8 lb

## 2019-09-13 DIAGNOSIS — Z953 Presence of xenogenic heart valve: Secondary | ICD-10-CM

## 2019-09-13 DIAGNOSIS — R55 Syncope and collapse: Secondary | ICD-10-CM | POA: Diagnosis not present

## 2019-09-13 DIAGNOSIS — I10 Essential (primary) hypertension: Secondary | ICD-10-CM | POA: Diagnosis not present

## 2019-09-13 DIAGNOSIS — E782 Mixed hyperlipidemia: Secondary | ICD-10-CM

## 2019-09-13 NOTE — Patient Instructions (Signed)
Medication Instructions:  Your physician recommends that you continue on your current medications as directed. Please refer to the Current Medication list given to you today.  *If you need a refill on your cardiac medications before your next appointment, please call your pharmacy*  Lab Work: None  If you have labs (blood work) drawn today and your tests are completely normal, you will receive your results only by: [name] MyChart Message (if you have MyChart) OR . A paper copy in the mail If you have any lab test that is abnormal or we need to change your treatment, we will call you to review the results.  Testing/Procedures: You had an EKG today.   Your physician has recommended that you wear a ZIO monitor. ZIO monitors are medical devices that record the heart's electrical activity. Doctors most often use these monitors to diagnose arrhythmias. Arrhythmias are problems with the speed or rhythm of the heartbeat. The monitor is a small, portable device. You can wear one while you do your normal daily activities. This is usually used to diagnose what is causing palpitations/syncope (passing out). Wear for 7 days.   Follow-Up: At [facility], you and your health needs are our priority.  As part of our continuing mission to provide you with exceptional heart care, we have created designated Provider Care Teams.  These Care Teams include your primary Cardiologist (physician) and Advanced Practice Providers (APPs -  Physician Assistants and Nurse Practitioners) who all work together to provide you with the care you need, when you need it.  Your next appointment:   12 months  The format for your next appointment:   In Person  Provider:   Shirlee More, MD
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
## 2019-09-13 NOTE — Addendum Note (Signed)
Addended by: Austin Miles on: 09/13/2019 03:24 PM   Modules accepted: Orders

## 2019-09-14 DIAGNOSIS — H52223 Regular astigmatism, bilateral: Secondary | ICD-10-CM | POA: Diagnosis not present

## 2019-09-14 DIAGNOSIS — H814 Vertigo of central origin: Secondary | ICD-10-CM | POA: Diagnosis not present

## 2019-09-14 DIAGNOSIS — Z961 Presence of intraocular lens: Secondary | ICD-10-CM | POA: Diagnosis not present

## 2019-09-14 DIAGNOSIS — H524 Presbyopia: Secondary | ICD-10-CM | POA: Diagnosis not present

## 2019-09-14 DIAGNOSIS — H532 Diplopia: Secondary | ICD-10-CM | POA: Diagnosis not present

## 2019-09-26 IMAGING — CR DG CHEST 2V
2 series · 2 of 2 positions shown · non-contrast
Comparison: 06/26/2017

CLINICAL DATA: Preop evaluation for upcoming aortic surgery

EXAM:
CHEST - 2 VIEW

[w chest pa]
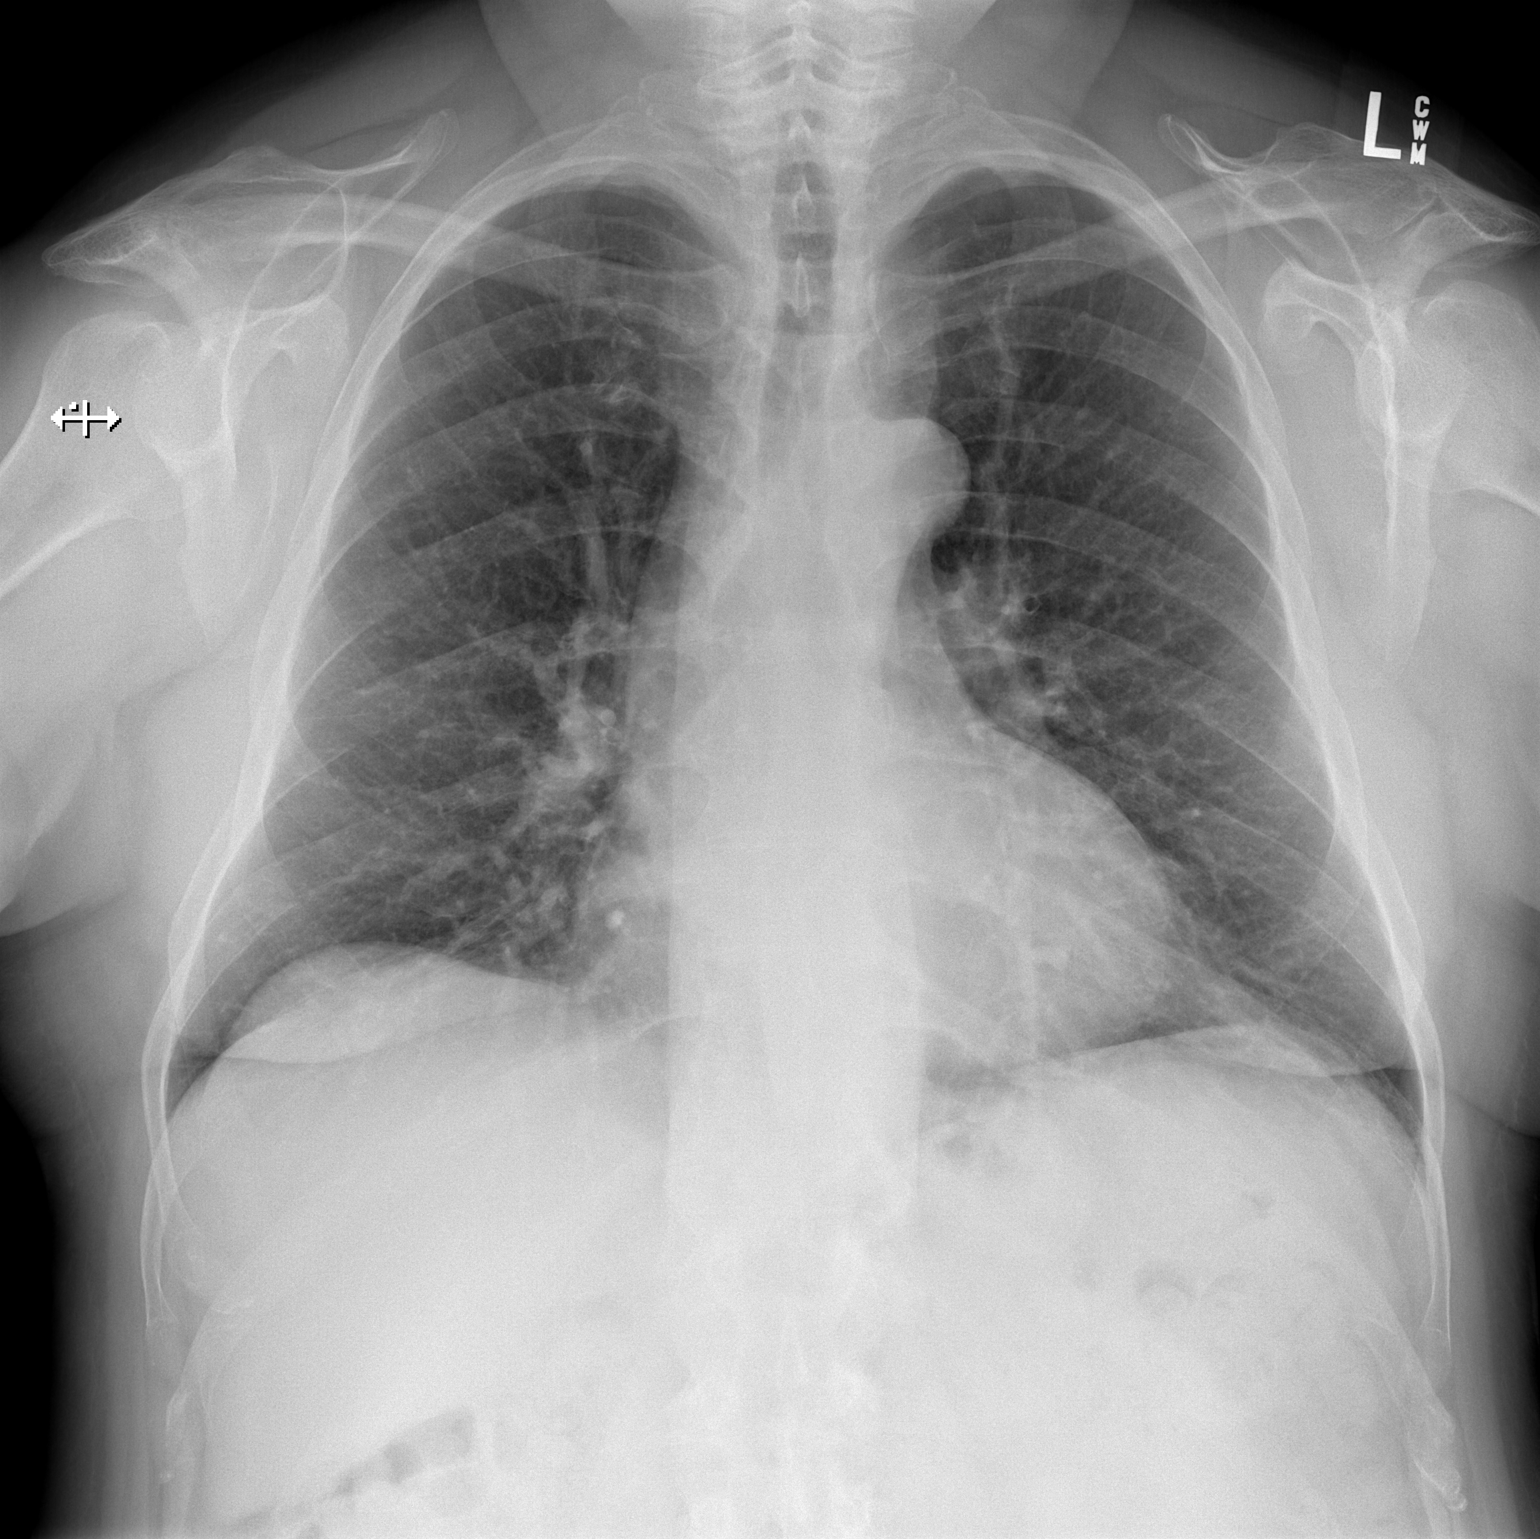

[w chest lat]
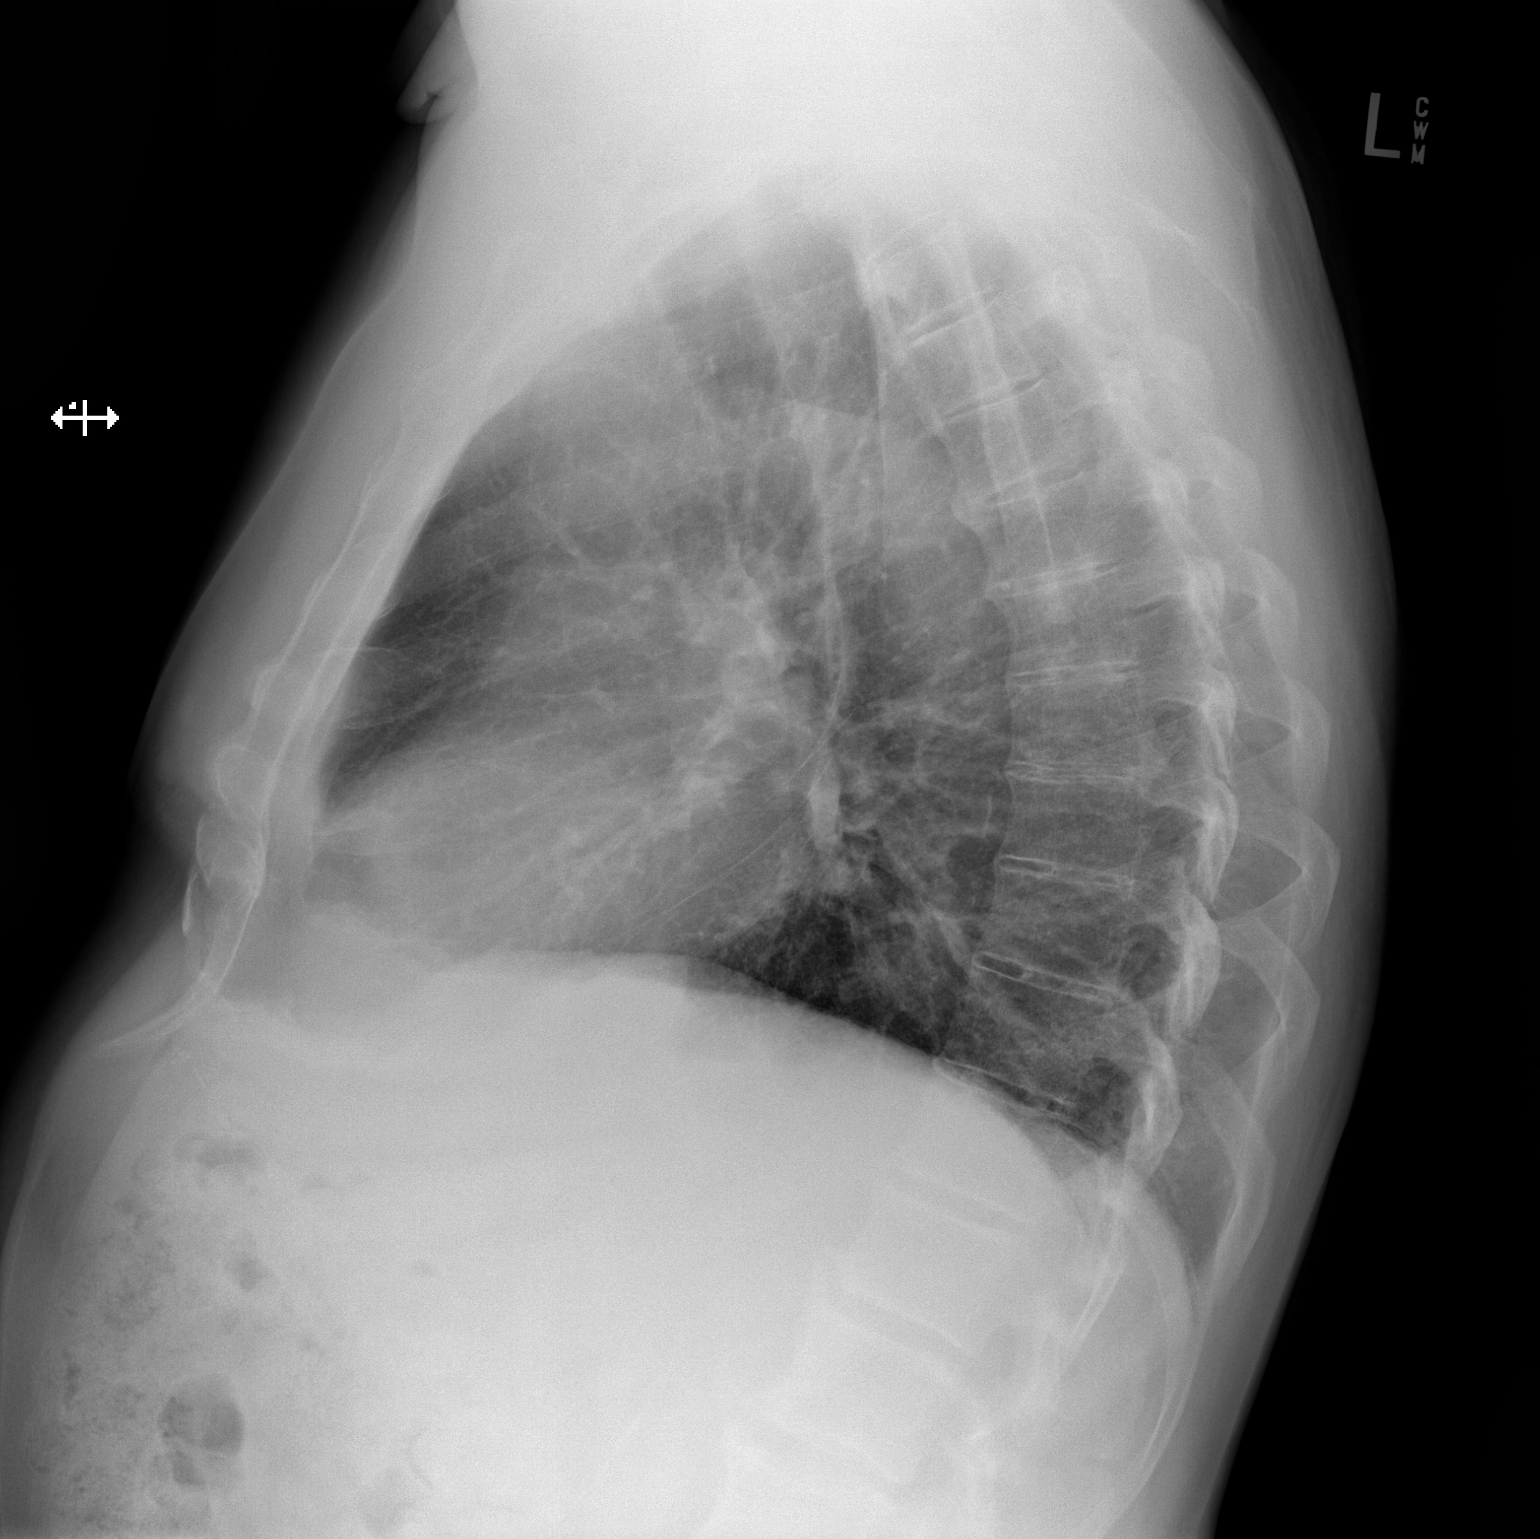

[2 of 2 positions shown; findings below may reference images not displayed]

FINDINGS: Cardiac shadow is stable. The lungs are well aerated bilaterally. No
focal infiltrate or sizable effusion is seen. Mild degenerative
change of the thoracic spine is seen.
IMPRESSION: No acute abnormality noted.

## 2019-09-29 DIAGNOSIS — R55 Syncope and collapse: Secondary | ICD-10-CM | POA: Diagnosis not present

## 2019-09-30 IMAGING — DX DG CHEST 1V PORT
1 series · 1 of 1 positions shown · non-contrast
Comparison: 05/14/2018.

CLINICAL DATA: Status post aortic valve replacement.

EXAM:
PORTABLE CHEST 1 VIEW

[chest ap]
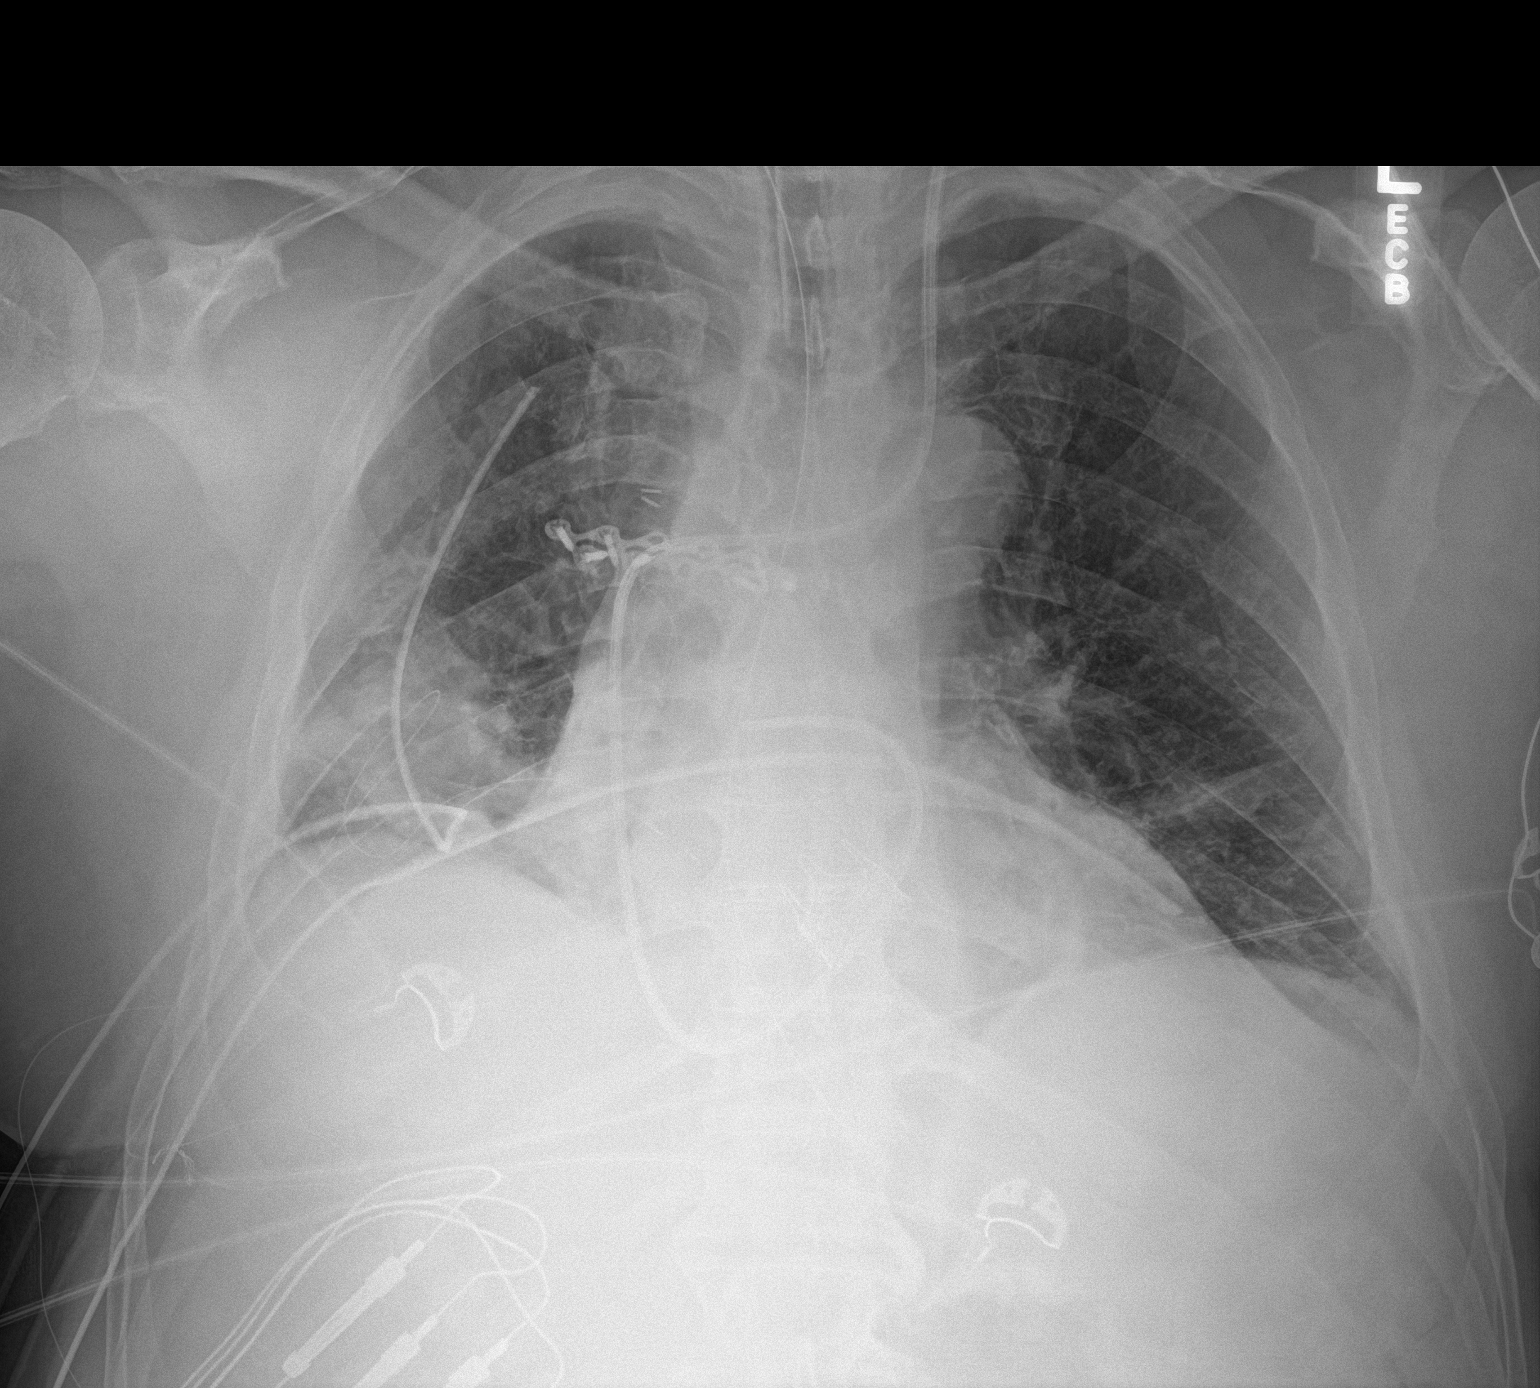

[1 of 1 positions shown; findings below may reference images not displayed]

FINDINGS: Endotracheal tube in satisfactory position. Left jugular Swan-Ganz
catheter tip in the proximal right main pulmonary artery. Prosthetic
aortic valve. Nasogastric tube extending into the stomach without
clear visualization of the tip. Mediastinal and right chest tubes
without pneumothorax.

Poor inspiration with bibasilar atelectasis. Grossly normal sized
heart. Thoracolumbar spine degenerative changes.
IMPRESSION: Poor inspiration with bibasilar atelectasis.

## 2019-10-01 IMAGING — DX DG CHEST 1V PORT
1 series · 1 of 1 positions shown · non-contrast
Comparison: 05/18/2018

CLINICAL DATA: Status post aortic valve repair

EXAM:
PORTABLE CHEST 1 VIEW

[chest ap]
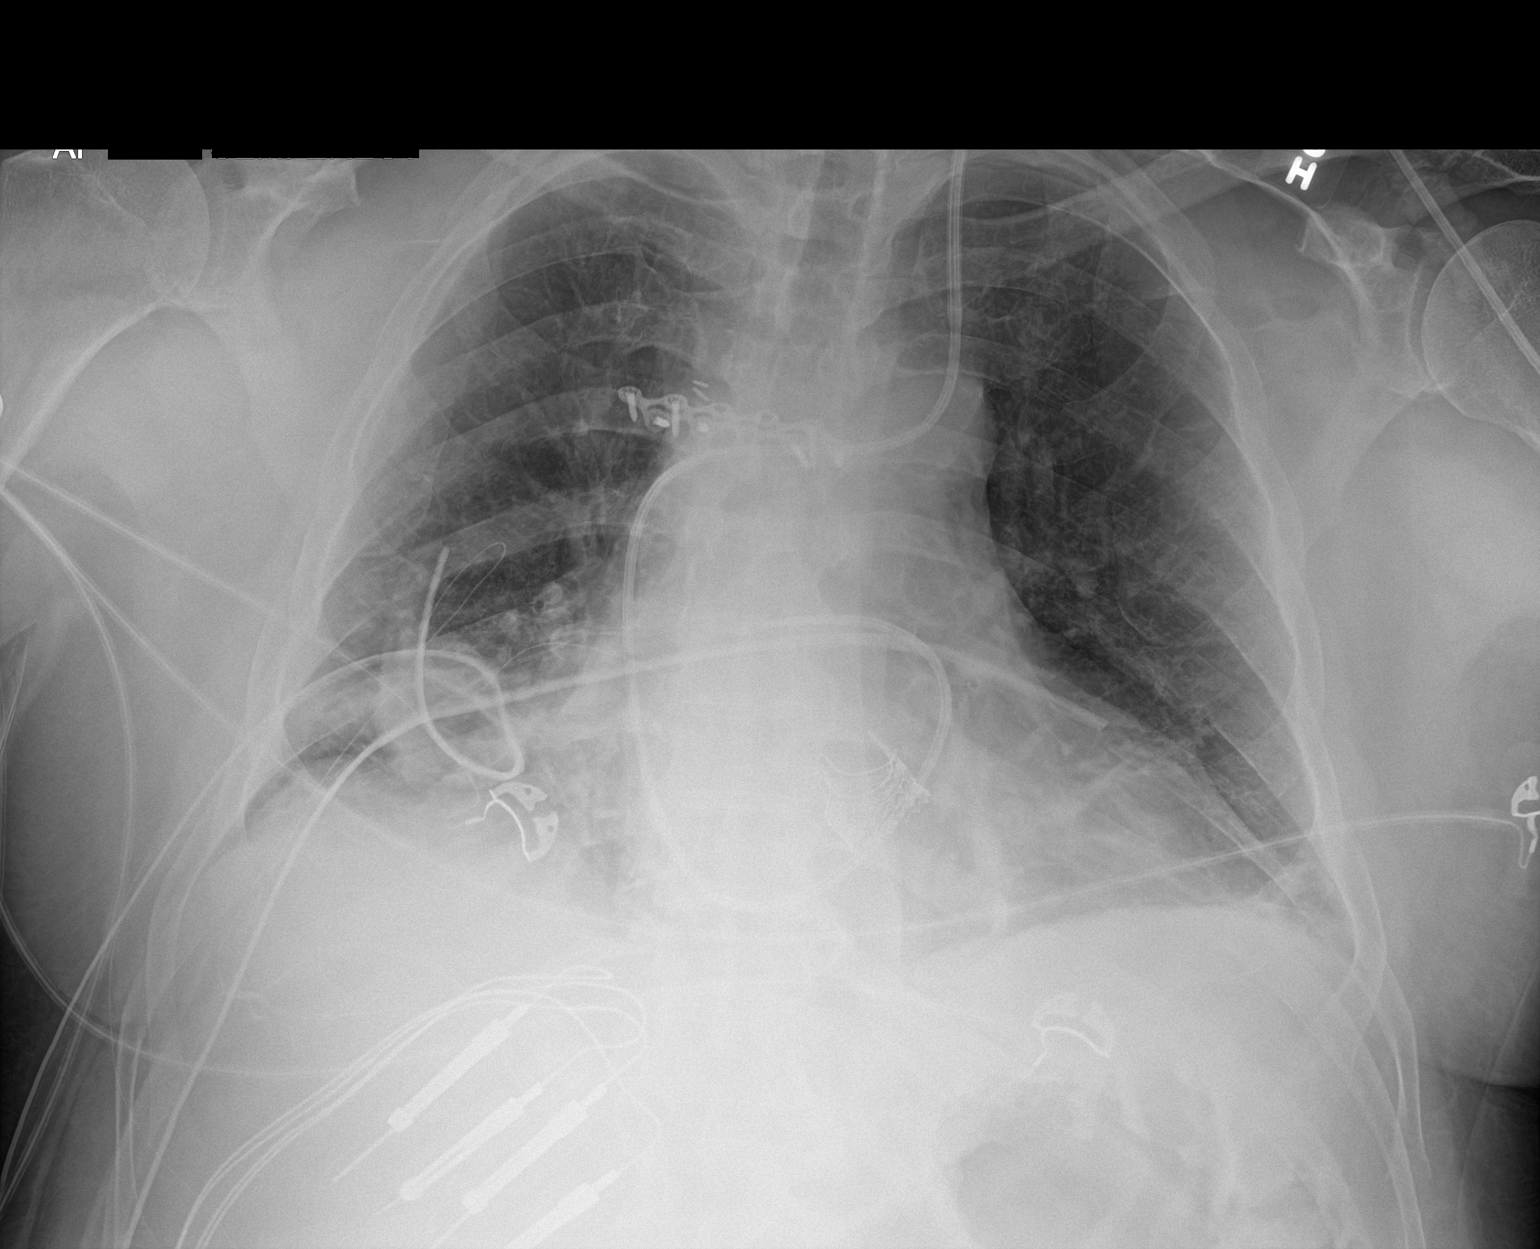

[1 of 1 positions shown; findings below may reference images not displayed]

FINDINGS: Swan Ganz catheter is again noted. Changes consistent with recent
aortic valve repair are again noted. Pericardial drain and right
thoracostomy drain are again seen. No pneumothorax is noted. Lungs
are well aerated bilaterally. Mild bibasilar atelectasis is noted.
Endotracheal tube and nasogastric catheter been removed in the
interval.
IMPRESSION: Stable bibasilar atelectasis.  No pneumothorax is noted.

Postsurgical changes as described.

## 2019-10-06 IMAGING — DX DG CHEST 1V PORT
1 series · 1 of 1 positions shown · non-contrast
Comparison: May 22, 2018

CLINICAL DATA: Pneumothorax with chest tube in place

EXAM:
PORTABLE CHEST 1 VIEW

[chest ap]
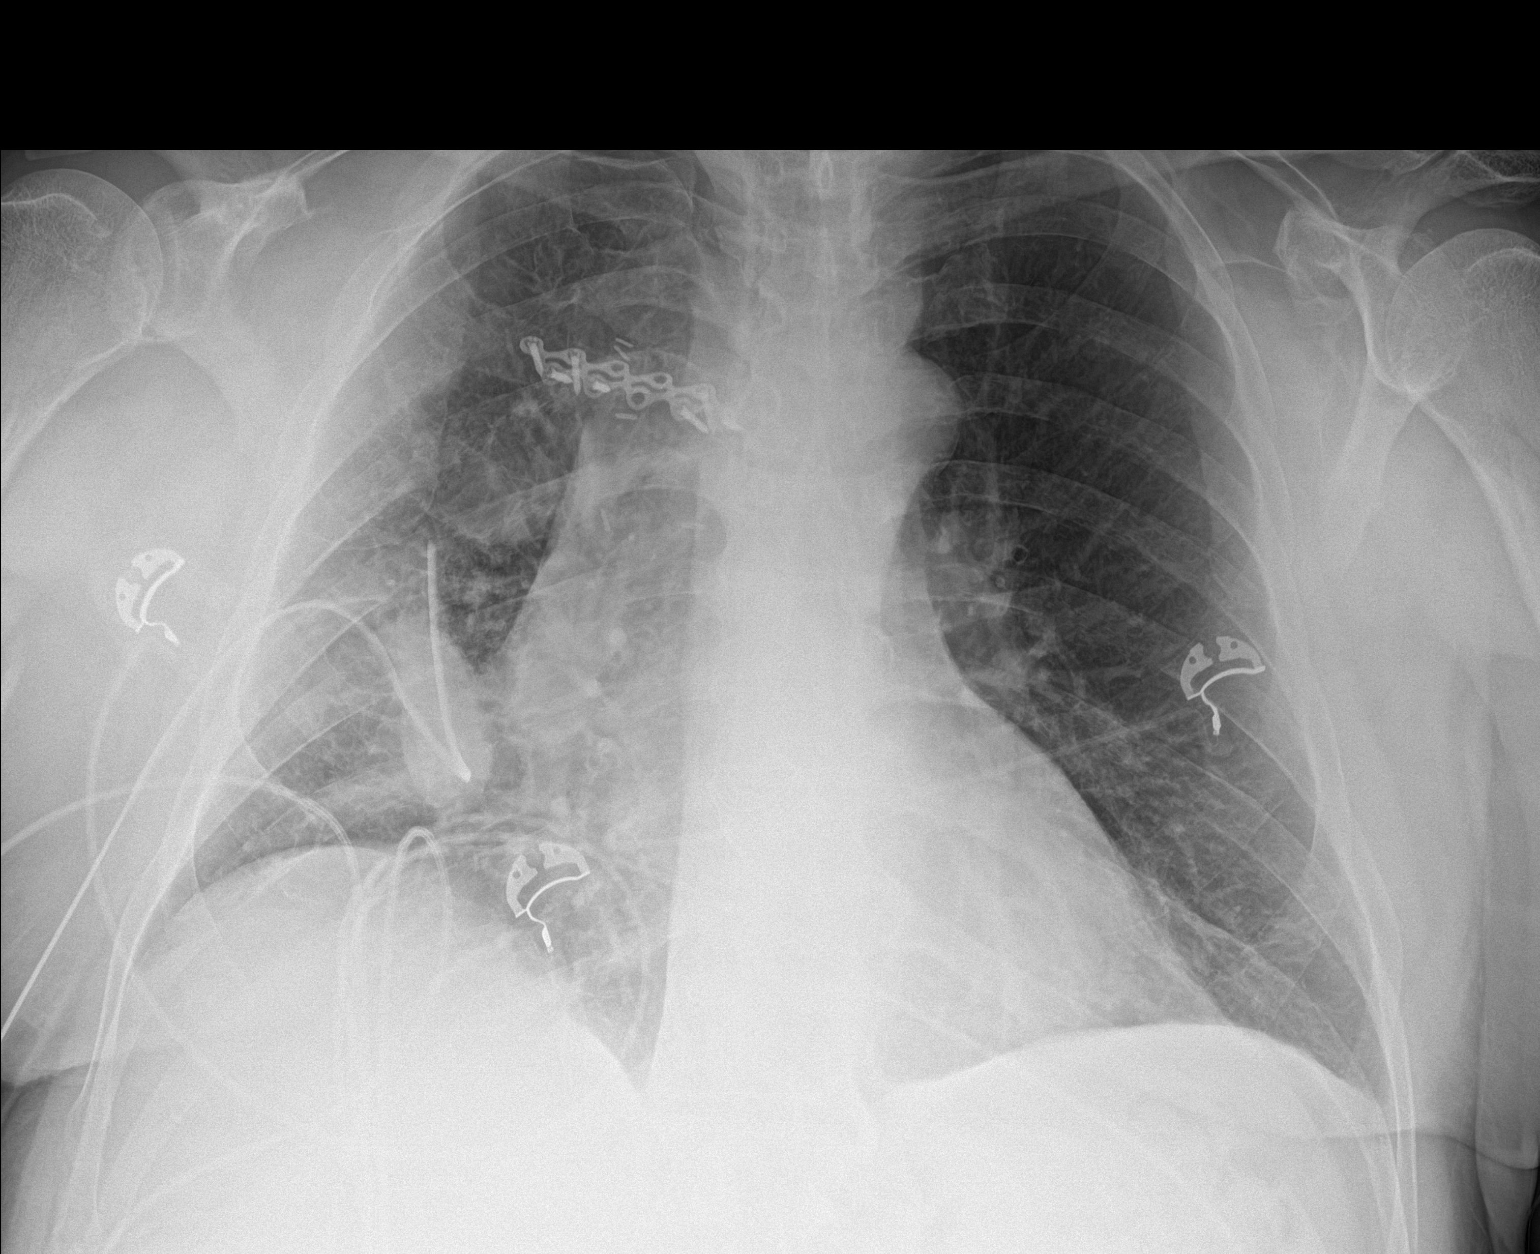

[1 of 1 positions shown; findings below may reference images not displayed]

FINDINGS: Chest tube remains on the right with small right apical
pneumothorax, essentially stable. There is opacification at the
level of the chest tube on the right, likely atelectasis or
contusion. Lungs elsewhere are clear. The heart size and pulmonary
vascularity are normal. No adenopathy. There is postoperative change
on the right, stable. Patient is status post aortic valve
replacement.
IMPRESSION: Persistent small right apical pneumothorax without tension
component. Chest tube position on the right stable. Question
contusion at chest tube placement site, stable. No edema or
consolidation. Status post aortic valve replacement. Stable cardiac
silhouette.

## 2019-10-07 ENCOUNTER — Telehealth: Payer: Self-pay

## 2019-10-07 DIAGNOSIS — R55 Syncope and collapse: Secondary | ICD-10-CM

## 2019-10-07 DIAGNOSIS — I493 Ventricular premature depolarization: Secondary | ICD-10-CM

## 2019-10-07 NOTE — Telephone Encounter (Signed)
-----   Message from Richardo Priest, MD sent at 10/04/2019  8:06 AM EST ----- Normal or stable result  2 short episodes of runs of PVCs or nonsustained ventricular tachycardia and with his episode of near loss of consciousness I like him to see Dr. Curt Bears in consultation.

## 2019-10-07 NOTE — Telephone Encounter (Signed)
Patient informed of results and that Dr Bettina Gavia would like him to be seen by Dr Curt Bears.  Patient agreed to plan and verbalized understanding. No further questions.    Referral entered for EP Consult with Dr Curt Bears.

## 2019-10-13 DIAGNOSIS — R05 Cough: Secondary | ICD-10-CM | POA: Diagnosis not present

## 2019-10-17 ENCOUNTER — Telehealth: Payer: Self-pay | Admitting: Cardiology

## 2019-10-17 NOTE — Telephone Encounter (Signed)
Was just diagnosed with COVID and wants to know if there's any special precautions he needs to take

## 2019-10-18 NOTE — Telephone Encounter (Signed)
Spoke with Dr. Bettina Gavia who advised for patient to monitor his temperature, oxygen saturation, and any signs of shortness of breath. Patient informed that if he notices an elevated temperature, low oxygen saturation, or worsening shortness of breath to contact his PCP for further advisement. Patient is agreeable to plan and verbalized understanding. No further questions.

## 2019-10-25 ENCOUNTER — Institutional Professional Consult (permissible substitution): Payer: Medicare Other | Admitting: Cardiology

## 2019-11-10 DIAGNOSIS — K219 Gastro-esophageal reflux disease without esophagitis: Secondary | ICD-10-CM | POA: Diagnosis not present

## 2019-11-10 DIAGNOSIS — Z6833 Body mass index (BMI) 33.0-33.9, adult: Secondary | ICD-10-CM | POA: Diagnosis not present

## 2019-11-10 DIAGNOSIS — N5203 Combined arterial insufficiency and corporo-venous occlusive erectile dysfunction: Secondary | ICD-10-CM | POA: Diagnosis not present

## 2019-11-10 DIAGNOSIS — Z23 Encounter for immunization: Secondary | ICD-10-CM | POA: Diagnosis not present

## 2019-11-10 DIAGNOSIS — I10 Essential (primary) hypertension: Secondary | ICD-10-CM | POA: Diagnosis not present

## 2019-11-10 DIAGNOSIS — Z952 Presence of prosthetic heart valve: Secondary | ICD-10-CM | POA: Diagnosis not present

## 2019-11-10 DIAGNOSIS — N138 Other obstructive and reflux uropathy: Secondary | ICD-10-CM | POA: Diagnosis not present

## 2019-11-10 DIAGNOSIS — G4733 Obstructive sleep apnea (adult) (pediatric): Secondary | ICD-10-CM | POA: Diagnosis not present

## 2019-11-10 DIAGNOSIS — G4700 Insomnia, unspecified: Secondary | ICD-10-CM | POA: Diagnosis not present

## 2019-11-10 DIAGNOSIS — E782 Mixed hyperlipidemia: Secondary | ICD-10-CM | POA: Diagnosis not present

## 2019-11-10 DIAGNOSIS — K449 Diaphragmatic hernia without obstruction or gangrene: Secondary | ICD-10-CM | POA: Diagnosis not present

## 2019-11-10 DIAGNOSIS — N401 Enlarged prostate with lower urinary tract symptoms: Secondary | ICD-10-CM | POA: Diagnosis not present

## 2019-11-29 ENCOUNTER — Institutional Professional Consult (permissible substitution): Payer: Medicare Other | Admitting: Cardiology

## 2020-01-02 DIAGNOSIS — I472 Ventricular tachycardia: Secondary | ICD-10-CM | POA: Diagnosis not present

## 2020-01-02 DIAGNOSIS — I493 Ventricular premature depolarization: Secondary | ICD-10-CM | POA: Diagnosis not present

## 2020-01-02 DIAGNOSIS — Z720 Tobacco use: Secondary | ICD-10-CM | POA: Diagnosis not present

## 2020-01-02 DIAGNOSIS — Z952 Presence of prosthetic heart valve: Secondary | ICD-10-CM | POA: Diagnosis not present

## 2020-01-02 DIAGNOSIS — E782 Mixed hyperlipidemia: Secondary | ICD-10-CM | POA: Diagnosis not present

## 2020-01-02 DIAGNOSIS — Z6832 Body mass index (BMI) 32.0-32.9, adult: Secondary | ICD-10-CM | POA: Diagnosis not present

## 2020-01-02 DIAGNOSIS — I10 Essential (primary) hypertension: Secondary | ICD-10-CM | POA: Diagnosis not present

## 2020-01-02 DIAGNOSIS — R638 Other symptoms and signs concerning food and fluid intake: Secondary | ICD-10-CM | POA: Diagnosis not present

## 2020-01-02 DIAGNOSIS — R42 Dizziness and giddiness: Secondary | ICD-10-CM | POA: Diagnosis not present

## 2020-01-06 DIAGNOSIS — E782 Mixed hyperlipidemia: Secondary | ICD-10-CM | POA: Diagnosis not present

## 2020-01-06 DIAGNOSIS — R42 Dizziness and giddiness: Secondary | ICD-10-CM | POA: Diagnosis not present

## 2020-01-06 DIAGNOSIS — I472 Ventricular tachycardia: Secondary | ICD-10-CM | POA: Diagnosis not present

## 2020-01-06 DIAGNOSIS — I10 Essential (primary) hypertension: Secondary | ICD-10-CM | POA: Diagnosis not present

## 2020-01-06 DIAGNOSIS — Z952 Presence of prosthetic heart valve: Secondary | ICD-10-CM | POA: Diagnosis not present

## 2020-01-10 DIAGNOSIS — E782 Mixed hyperlipidemia: Secondary | ICD-10-CM | POA: Diagnosis not present

## 2020-01-10 DIAGNOSIS — N521 Erectile dysfunction due to diseases classified elsewhere: Secondary | ICD-10-CM | POA: Diagnosis not present

## 2020-01-10 DIAGNOSIS — N486 Induration penis plastica: Secondary | ICD-10-CM | POA: Diagnosis not present

## 2020-01-10 DIAGNOSIS — Z6832 Body mass index (BMI) 32.0-32.9, adult: Secondary | ICD-10-CM | POA: Diagnosis not present

## 2020-01-10 DIAGNOSIS — I10 Essential (primary) hypertension: Secondary | ICD-10-CM | POA: Diagnosis not present

## 2020-01-10 DIAGNOSIS — N138 Other obstructive and reflux uropathy: Secondary | ICD-10-CM | POA: Diagnosis not present

## 2020-01-10 DIAGNOSIS — N401 Enlarged prostate with lower urinary tract symptoms: Secondary | ICD-10-CM | POA: Diagnosis not present

## 2020-02-23 DIAGNOSIS — Z6833 Body mass index (BMI) 33.0-33.9, adult: Secondary | ICD-10-CM | POA: Diagnosis not present

## 2020-02-23 DIAGNOSIS — I472 Ventricular tachycardia: Secondary | ICD-10-CM | POA: Diagnosis not present

## 2020-02-23 DIAGNOSIS — I10 Essential (primary) hypertension: Secondary | ICD-10-CM | POA: Diagnosis not present

## 2020-02-23 DIAGNOSIS — E782 Mixed hyperlipidemia: Secondary | ICD-10-CM | POA: Diagnosis not present

## 2020-02-23 DIAGNOSIS — Z954 Presence of other heart-valve replacement: Secondary | ICD-10-CM | POA: Diagnosis not present

## 2020-04-26 DIAGNOSIS — M1612 Unilateral primary osteoarthritis, left hip: Secondary | ICD-10-CM | POA: Diagnosis not present

## 2020-04-26 DIAGNOSIS — G4733 Obstructive sleep apnea (adult) (pediatric): Secondary | ICD-10-CM | POA: Diagnosis not present

## 2020-04-26 DIAGNOSIS — Z6832 Body mass index (BMI) 32.0-32.9, adult: Secondary | ICD-10-CM | POA: Diagnosis not present

## 2020-04-30 DIAGNOSIS — Z6832 Body mass index (BMI) 32.0-32.9, adult: Secondary | ICD-10-CM | POA: Diagnosis not present

## 2020-04-30 DIAGNOSIS — M5442 Lumbago with sciatica, left side: Secondary | ICD-10-CM | POA: Diagnosis not present

## 2020-04-30 DIAGNOSIS — M1612 Unilateral primary osteoarthritis, left hip: Secondary | ICD-10-CM | POA: Diagnosis not present

## 2020-05-24 DIAGNOSIS — M25511 Pain in right shoulder: Secondary | ICD-10-CM | POA: Diagnosis not present

## 2020-05-25 DIAGNOSIS — M545 Low back pain: Secondary | ICD-10-CM | POA: Diagnosis not present

## 2020-05-29 DIAGNOSIS — M545 Low back pain: Secondary | ICD-10-CM | POA: Diagnosis not present

## 2020-05-30 DIAGNOSIS — M545 Low back pain: Secondary | ICD-10-CM | POA: Diagnosis not present

## 2020-05-31 DIAGNOSIS — M75111 Incomplete rotator cuff tear or rupture of right shoulder, not specified as traumatic: Secondary | ICD-10-CM | POA: Diagnosis not present

## 2020-06-01 DIAGNOSIS — M545 Low back pain: Secondary | ICD-10-CM | POA: Diagnosis not present

## 2020-06-04 DIAGNOSIS — M545 Low back pain: Secondary | ICD-10-CM | POA: Diagnosis not present

## 2020-06-05 DIAGNOSIS — M545 Low back pain: Secondary | ICD-10-CM | POA: Diagnosis not present

## 2020-06-05 DIAGNOSIS — M25511 Pain in right shoulder: Secondary | ICD-10-CM | POA: Diagnosis not present

## 2020-06-06 DIAGNOSIS — M545 Low back pain: Secondary | ICD-10-CM | POA: Diagnosis not present

## 2020-06-07 DIAGNOSIS — Z6832 Body mass index (BMI) 32.0-32.9, adult: Secondary | ICD-10-CM | POA: Diagnosis not present

## 2020-06-07 DIAGNOSIS — M545 Low back pain: Secondary | ICD-10-CM | POA: Diagnosis not present

## 2020-06-07 DIAGNOSIS — Z20822 Contact with and (suspected) exposure to covid-19: Secondary | ICD-10-CM | POA: Diagnosis not present

## 2020-06-07 DIAGNOSIS — Z7189 Other specified counseling: Secondary | ICD-10-CM | POA: Diagnosis not present

## 2020-06-08 DIAGNOSIS — M5127 Other intervertebral disc displacement, lumbosacral region: Secondary | ICD-10-CM | POA: Diagnosis not present

## 2020-06-08 DIAGNOSIS — M5137 Other intervertebral disc degeneration, lumbosacral region: Secondary | ICD-10-CM | POA: Diagnosis not present

## 2020-06-08 DIAGNOSIS — M5126 Other intervertebral disc displacement, lumbar region: Secondary | ICD-10-CM | POA: Diagnosis not present

## 2020-06-08 DIAGNOSIS — M47817 Spondylosis without myelopathy or radiculopathy, lumbosacral region: Secondary | ICD-10-CM | POA: Diagnosis not present

## 2020-06-08 DIAGNOSIS — M545 Low back pain: Secondary | ICD-10-CM | POA: Diagnosis not present

## 2020-06-12 DIAGNOSIS — G4733 Obstructive sleep apnea (adult) (pediatric): Secondary | ICD-10-CM | POA: Diagnosis not present

## 2020-06-13 DIAGNOSIS — M545 Low back pain: Secondary | ICD-10-CM | POA: Diagnosis not present

## 2020-06-14 DIAGNOSIS — G4733 Obstructive sleep apnea (adult) (pediatric): Secondary | ICD-10-CM | POA: Diagnosis not present

## 2020-06-15 DIAGNOSIS — M545 Low back pain: Secondary | ICD-10-CM | POA: Diagnosis not present

## 2020-06-20 DIAGNOSIS — M545 Low back pain: Secondary | ICD-10-CM | POA: Diagnosis not present

## 2020-06-26 DIAGNOSIS — M47816 Spondylosis without myelopathy or radiculopathy, lumbar region: Secondary | ICD-10-CM | POA: Diagnosis not present

## 2020-06-26 DIAGNOSIS — G4733 Obstructive sleep apnea (adult) (pediatric): Secondary | ICD-10-CM | POA: Diagnosis not present

## 2020-06-26 DIAGNOSIS — M545 Low back pain: Secondary | ICD-10-CM | POA: Diagnosis not present

## 2020-06-26 DIAGNOSIS — M2550 Pain in unspecified joint: Secondary | ICD-10-CM | POA: Diagnosis not present

## 2020-06-26 DIAGNOSIS — Z6831 Body mass index (BMI) 31.0-31.9, adult: Secondary | ICD-10-CM | POA: Diagnosis not present

## 2020-06-26 DIAGNOSIS — F17200 Nicotine dependence, unspecified, uncomplicated: Secondary | ICD-10-CM | POA: Diagnosis not present

## 2020-06-26 DIAGNOSIS — R748 Abnormal levels of other serum enzymes: Secondary | ICD-10-CM | POA: Diagnosis not present

## 2020-06-29 DIAGNOSIS — M7070 Other bursitis of hip, unspecified hip: Secondary | ICD-10-CM | POA: Diagnosis not present

## 2020-06-29 DIAGNOSIS — M7060 Trochanteric bursitis, unspecified hip: Secondary | ICD-10-CM | POA: Diagnosis not present

## 2020-06-29 DIAGNOSIS — M169 Osteoarthritis of hip, unspecified: Secondary | ICD-10-CM | POA: Diagnosis not present

## 2020-07-02 DIAGNOSIS — M25511 Pain in right shoulder: Secondary | ICD-10-CM | POA: Diagnosis not present

## 2020-07-04 DIAGNOSIS — G4733 Obstructive sleep apnea (adult) (pediatric): Secondary | ICD-10-CM | POA: Diagnosis not present

## 2020-07-04 DIAGNOSIS — Z6832 Body mass index (BMI) 32.0-32.9, adult: Secondary | ICD-10-CM | POA: Diagnosis not present

## 2020-07-04 DIAGNOSIS — G4700 Insomnia, unspecified: Secondary | ICD-10-CM | POA: Diagnosis not present

## 2020-07-10 DIAGNOSIS — M7062 Trochanteric bursitis, left hip: Secondary | ICD-10-CM | POA: Diagnosis not present

## 2020-07-10 DIAGNOSIS — M7061 Trochanteric bursitis, right hip: Secondary | ICD-10-CM | POA: Diagnosis not present

## 2020-07-10 DIAGNOSIS — M7060 Trochanteric bursitis, unspecified hip: Secondary | ICD-10-CM | POA: Diagnosis not present

## 2020-07-12 DIAGNOSIS — M7062 Trochanteric bursitis, left hip: Secondary | ICD-10-CM | POA: Diagnosis not present

## 2020-07-12 DIAGNOSIS — M7551 Bursitis of right shoulder: Secondary | ICD-10-CM | POA: Diagnosis not present

## 2020-07-12 DIAGNOSIS — M7552 Bursitis of left shoulder: Secondary | ICD-10-CM | POA: Diagnosis not present

## 2020-07-12 DIAGNOSIS — M353 Polymyalgia rheumatica: Secondary | ICD-10-CM | POA: Diagnosis not present

## 2020-07-12 DIAGNOSIS — M7061 Trochanteric bursitis, right hip: Secondary | ICD-10-CM | POA: Diagnosis not present

## 2020-07-31 DIAGNOSIS — Z79899 Other long term (current) drug therapy: Secondary | ICD-10-CM | POA: Diagnosis not present

## 2020-07-31 DIAGNOSIS — M7551 Bursitis of right shoulder: Secondary | ICD-10-CM | POA: Diagnosis not present

## 2020-07-31 DIAGNOSIS — M7552 Bursitis of left shoulder: Secondary | ICD-10-CM | POA: Diagnosis not present

## 2020-07-31 DIAGNOSIS — M353 Polymyalgia rheumatica: Secondary | ICD-10-CM | POA: Diagnosis not present

## 2020-08-03 DIAGNOSIS — H43812 Vitreous degeneration, left eye: Secondary | ICD-10-CM | POA: Diagnosis not present

## 2020-08-09 DIAGNOSIS — M180 Bilateral primary osteoarthritis of first carpometacarpal joints: Secondary | ICD-10-CM | POA: Diagnosis not present

## 2020-08-09 DIAGNOSIS — M7551 Bursitis of right shoulder: Secondary | ICD-10-CM | POA: Diagnosis not present

## 2020-08-09 DIAGNOSIS — M7061 Trochanteric bursitis, right hip: Secondary | ICD-10-CM | POA: Diagnosis not present

## 2020-08-09 DIAGNOSIS — M353 Polymyalgia rheumatica: Secondary | ICD-10-CM | POA: Diagnosis not present

## 2020-08-15 DIAGNOSIS — M353 Polymyalgia rheumatica: Secondary | ICD-10-CM | POA: Diagnosis not present

## 2020-08-15 DIAGNOSIS — M7060 Trochanteric bursitis, unspecified hip: Secondary | ICD-10-CM | POA: Diagnosis not present

## 2020-08-15 DIAGNOSIS — M7070 Other bursitis of hip, unspecified hip: Secondary | ICD-10-CM | POA: Diagnosis not present

## 2020-08-28 DIAGNOSIS — H43812 Vitreous degeneration, left eye: Secondary | ICD-10-CM | POA: Diagnosis not present

## 2020-08-28 DIAGNOSIS — Z961 Presence of intraocular lens: Secondary | ICD-10-CM | POA: Diagnosis not present

## 2020-09-11 DIAGNOSIS — Z8709 Personal history of other diseases of the respiratory system: Secondary | ICD-10-CM | POA: Diagnosis not present

## 2020-09-11 DIAGNOSIS — B9689 Other specified bacterial agents as the cause of diseases classified elsewhere: Secondary | ICD-10-CM | POA: Diagnosis not present

## 2020-09-11 DIAGNOSIS — Z952 Presence of prosthetic heart valve: Secondary | ICD-10-CM | POA: Diagnosis not present

## 2020-09-11 DIAGNOSIS — J019 Acute sinusitis, unspecified: Secondary | ICD-10-CM | POA: Diagnosis not present

## 2020-09-11 DIAGNOSIS — K068 Other specified disorders of gingiva and edentulous alveolar ridge: Secondary | ICD-10-CM | POA: Diagnosis not present

## 2020-10-26 DIAGNOSIS — M7010 Bursitis, unspecified hand: Secondary | ICD-10-CM | POA: Diagnosis not present

## 2020-11-09 DIAGNOSIS — E782 Mixed hyperlipidemia: Secondary | ICD-10-CM | POA: Diagnosis not present

## 2020-11-09 DIAGNOSIS — N401 Enlarged prostate with lower urinary tract symptoms: Secondary | ICD-10-CM | POA: Diagnosis not present

## 2020-11-09 DIAGNOSIS — Z6834 Body mass index (BMI) 34.0-34.9, adult: Secondary | ICD-10-CM | POA: Diagnosis not present

## 2020-11-09 DIAGNOSIS — Z23 Encounter for immunization: Secondary | ICD-10-CM | POA: Diagnosis not present

## 2020-11-09 DIAGNOSIS — N138 Other obstructive and reflux uropathy: Secondary | ICD-10-CM | POA: Diagnosis not present

## 2020-11-09 DIAGNOSIS — Z125 Encounter for screening for malignant neoplasm of prostate: Secondary | ICD-10-CM | POA: Diagnosis not present

## 2020-11-09 DIAGNOSIS — Z0000 Encounter for general adult medical examination without abnormal findings: Secondary | ICD-10-CM | POA: Diagnosis not present

## 2020-11-09 DIAGNOSIS — I10 Essential (primary) hypertension: Secondary | ICD-10-CM | POA: Diagnosis not present

## 2020-11-15 DIAGNOSIS — R7309 Other abnormal glucose: Secondary | ICD-10-CM | POA: Diagnosis not present

## 2020-11-22 DIAGNOSIS — H43812 Vitreous degeneration, left eye: Secondary | ICD-10-CM | POA: Diagnosis not present

## 2020-11-22 DIAGNOSIS — M129 Arthropathy, unspecified: Secondary | ICD-10-CM | POA: Diagnosis not present

## 2020-11-22 DIAGNOSIS — Z961 Presence of intraocular lens: Secondary | ICD-10-CM | POA: Diagnosis not present
